# Patient Record
Sex: Female | Born: 1970 | Race: White | Hispanic: No | Marital: Married | Smoking: Never smoker
Health system: Southern US, Community
[De-identification: ages and names within clinical notes are randomized; demographics above are authoritative.]

## PROBLEM LIST (undated history)

## (undated) DIAGNOSIS — T7840XA Allergy, unspecified, initial encounter: Secondary | ICD-10-CM

## (undated) DIAGNOSIS — E039 Hypothyroidism, unspecified: Secondary | ICD-10-CM

## (undated) HISTORY — DX: Allergy, unspecified, initial encounter: T78.40XA

---

## 2006-09-20 HISTORY — PX: COLONOSCOPY: SHX174

## 2014-08-20 ENCOUNTER — Other Ambulatory Visit: Payer: Self-pay

## 2014-08-20 DIAGNOSIS — Z1231 Encounter for screening mammogram for malignant neoplasm of breast: Secondary | ICD-10-CM

## 2014-09-02 ENCOUNTER — Ambulatory Visit
Admission: RE | Admit: 2014-09-02 | Discharge: 2014-09-02 | Disposition: A | Discharge status: Home / Self Care | Payer: BC Managed Care – PPO | Source: Ambulatory Visit

## 2014-09-02 DIAGNOSIS — Z1231 Encounter for screening mammogram for malignant neoplasm of breast: Secondary | ICD-10-CM

## 2014-11-11 ENCOUNTER — Other Ambulatory Visit: Payer: Self-pay | Admitting: Obstetrics & Gynecology

## 2014-11-11 ENCOUNTER — Other Ambulatory Visit (HOSPITAL_COMMUNITY)
Admission: RE | Admit: 2014-11-11 | Discharge: 2014-11-11 | Disposition: A | Discharge status: Home / Self Care | Payer: BC Managed Care – PPO | Source: Ambulatory Visit | Attending: Obstetrics & Gynecology | Admitting: Obstetrics & Gynecology

## 2014-11-11 DIAGNOSIS — Z1151 Encounter for screening for human papillomavirus (HPV): Secondary | ICD-10-CM | POA: Diagnosis present

## 2014-11-11 DIAGNOSIS — Z01419 Encounter for gynecological examination (general) (routine) without abnormal findings: Secondary | ICD-10-CM | POA: Insufficient documentation

## 2014-11-12 LAB — CYTOLOGY - PAP

## 2015-11-17 ENCOUNTER — Other Ambulatory Visit: Payer: Self-pay

## 2015-11-17 DIAGNOSIS — Z1231 Encounter for screening mammogram for malignant neoplasm of breast: Secondary | ICD-10-CM

## 2015-11-27 ENCOUNTER — Ambulatory Visit
Admission: RE | Admit: 2015-11-27 | Discharge: 2015-11-27 | Disposition: A | Discharge status: Home / Self Care | Payer: PRIVATE HEALTH INSURANCE | Source: Ambulatory Visit

## 2015-11-27 DIAGNOSIS — Z1231 Encounter for screening mammogram for malignant neoplasm of breast: Secondary | ICD-10-CM

## 2015-11-28 ENCOUNTER — Other Ambulatory Visit: Payer: Self-pay | Admitting: Obstetrics & Gynecology

## 2015-11-28 DIAGNOSIS — N63 Unspecified lump in unspecified breast: Secondary | ICD-10-CM

## 2015-12-04 ENCOUNTER — Ambulatory Visit
Admission: RE | Admit: 2015-12-04 | Discharge: 2015-12-04 | Disposition: A | Discharge status: Home / Self Care | Payer: PRIVATE HEALTH INSURANCE | Source: Ambulatory Visit | Attending: Obstetrics & Gynecology | Admitting: Obstetrics & Gynecology

## 2015-12-04 DIAGNOSIS — N63 Unspecified lump in unspecified breast: Secondary | ICD-10-CM

## 2016-08-21 ENCOUNTER — Emergency Department (HOSPITAL_BASED_OUTPATIENT_CLINIC_OR_DEPARTMENT_OTHER)
Admission: EM | Admit: 2016-08-21 | Discharge: 2016-08-21 | Disposition: A | Discharge status: Home / Self Care | Payer: BLUE CROSS/BLUE SHIELD | Attending: Emergency Medicine | Admitting: Emergency Medicine

## 2016-08-21 ENCOUNTER — Encounter (HOSPITAL_BASED_OUTPATIENT_CLINIC_OR_DEPARTMENT_OTHER): Payer: Self-pay | Admitting: Emergency Medicine

## 2016-08-21 DIAGNOSIS — M791 Myalgia: Secondary | ICD-10-CM | POA: Insufficient documentation

## 2016-08-21 DIAGNOSIS — Z79899 Other long term (current) drug therapy: Secondary | ICD-10-CM | POA: Insufficient documentation

## 2016-08-21 DIAGNOSIS — R200 Anesthesia of skin: Secondary | ICD-10-CM | POA: Insufficient documentation

## 2016-08-21 DIAGNOSIS — R51 Headache: Secondary | ICD-10-CM | POA: Insufficient documentation

## 2016-08-21 DIAGNOSIS — R5383 Other fatigue: Secondary | ICD-10-CM | POA: Diagnosis not present

## 2016-08-21 DIAGNOSIS — R519 Headache, unspecified: Secondary | ICD-10-CM

## 2016-08-21 DIAGNOSIS — E039 Hypothyroidism, unspecified: Secondary | ICD-10-CM | POA: Diagnosis not present

## 2016-08-21 HISTORY — DX: Hypothyroidism, unspecified: E03.9

## 2016-08-21 MED ORDER — VALACYCLOVIR HCL 1 G PO TABS: 1000.0000 mg | ORAL_TABLET | Freq: Three times a day (TID) | ORAL | 0 refills | Status: DC

## 2016-08-21 MED ORDER — ACETAMINOPHEN-CODEINE #3 300-30 MG PO TABS: 1.0000 | ORAL_TABLET | Freq: Four times a day (QID) | ORAL | 0 refills | Status: AC | PRN

## 2016-08-21 MED ORDER — CARBAMAZEPINE 200 MG PO TABS: 100.0000 mg | ORAL_TABLET | Freq: Two times a day (BID) | ORAL | 0 refills | Status: DC

## 2016-08-21 NOTE — ED Provider Notes (Signed)
[facility] Provider Note   CSN: [id number] Arrival date & time: 08/21/16  1454  By signing my name below, I, Teofilo PodMatthew P. Jamison, attest that this documentation has been prepared under the direction and in the presence of Tilden FossaElizabeth Toni Demo, MD . Electronically Signed: Teofilo PodMatthew P. Jamison, ED Scribe. 08/21/2016. 3:48 PM.    History   Chief Complaint Chief Complaint  Patient presents with  . Facial Pain    The history is provided by the patient. No language interpreter was used.   HPI Comments:  Bianca Cooper is a 45 y.o. female who presents to the Emergency Department complaining of intermittent, worsening right sided facial pain x 1 week. Pt does not feel that the pt is coming from her teeth, and notes that the pain radiates to her neck. Pt describes the pain as "intense," and states that the episodes have not been lasting as long as the did at onset. Pt states that episodes last <10 minutes. Pt states that the area is tender to the touch. Pt has seen a dentist, endodontist and PCP this week for evaluation but could not idententify the cause of the pain. Pt was referred to the ED. Pt reports a short episode of numbness at 1pm today that has resolved, in addition to associated chills, general fatigue, and generalized body aches. Pt reports sick contact, husband had bronchitis 2 weeks ago. Pt was given Augmentin, tylenol, clindamycin with moderate relief. Pt denies possible pregnancy. Pt denies ear pain, facial swelling, hearing loss, and visual changes.   Past Medical History:  Diagnosis Date  . Hypothyroidism     There are no active problems to display for this patient.   History reviewed. No pertinent surgical history.  OB History    No data available       Home Medications    Prior to Admission medications   Medication Sig Start Date End Date Taking? Authorizing Provider  Acetaminophen-Codeine (TYLENOL WITH CODEINE #3 PO) Take by mouth.   Yes Historical  Provider, MD  clindamycin (CLEOCIN) 300 MG capsule Take 300 mg by mouth 4 (four) times daily.   Yes Historical Provider, MD  levothyroxine (SYNTHROID, LEVOTHROID) 100 MCG tablet Take 100 mcg by mouth daily before breakfast.   Yes Historical Provider, MD    Family History History reviewed. No pertinent family history.  Social History Social History  Substance Use Topics  . Smoking status: Never Smoker  . Smokeless tobacco: Never Used  . Alcohol use No     Allergies   Patient has no known allergies.   Review of Systems Review of Systems  Constitutional: Positive for chills and fatigue.  HENT: Positive for dental problem. Negative for ear pain, facial swelling and hearing loss.   Eyes: Negative for visual disturbance.  Musculoskeletal: Positive for myalgias.  Neurological: Positive for numbness.  All other systems reviewed and are negative.    Physical Exam Updated Vital Signs BP 144/97 (BP Location: Left Arm)   Pulse 95   Temp 98.1 F (36.7 C) (Oral)   Resp 20   Ht 5\' 3"  (1.6 m)   Wt 205 lb (93 kg)   LMP 07/31/2016   SpO2 100%   BMI 36.31 kg/m   Physical Exam  Constitutional: She is oriented to person, place, and time. She appears well-developed and well-nourished.  HENT:  Head: Normocephalic and atraumatic.  Right Ear: External ear normal.  Left Ear: External ear normal.  Nose: Nose normal.  Mouth/Throat: Oropharynx is clear and moist. No  oropharyngeal exudate.  Tenderness over right maxillary sinus  Eyes: Conjunctivae and EOM are normal. Pupils are equal, round, and reactive to light.  Neck: Neck supple.  Cardiovascular: Normal rate and regular rhythm.   No murmur heard. Pulmonary/Chest: Effort normal and breath sounds normal. No respiratory distress.  Musculoskeletal: She exhibits no edema or tenderness.  Lymphadenopathy:    She has no cervical adenopathy.  Neurological: She is alert and oriented to person, place, and time. No cranial nerve deficit.    Skin: Skin is warm and dry.  Psychiatric: She has a normal mood and affect. Her behavior is normal.  Nursing note and vitals reviewed.    ED Treatments / Results  DIAGNOSTIC STUDIES:  Oxygen Saturation is 100% on RA, normal by my interpretation.    COORDINATION OF CARE:  3:48 PM Discussed treatment plan with pt at bedside and pt agreed to plan.   Labs (all labs ordered are listed, but only abnormal results are displayed) Labs Reviewed - No data to display  EKG  EKG Interpretation None       Radiology No results found.  Procedures Procedures (including critical care time)  Medications Ordered in ED Medications - No data to display   Initial Impression / Assessment and Plan / ED Course  I have reviewed the triage vital signs and the nursing notes.  Pertinent labs & imaging results that were available during my care of the patient were reviewed by me and considered in my medical decision making (see chart for details).  Clinical Course     Patient here for violation of right facial pain over the last week with recent workups by dentistry and endodontics. She is nontoxic appearing on examination with no evidence of abscess or deep tissue infection. There are no neurologic deficits. Question trigeminal neuralgia versus developing shingles without rash. Will start treatment for shingles with close outpatient follow-up. Home care return precautions were discussed as well.  Final Clinical Impressions(s) / ED Diagnoses   Final diagnoses:  Facial pain, acute    New Prescriptions New Prescriptions   No medications on file  I personally performed the services described in this documentation, which was scribed in my presence. The recorded information has been reviewed and is accurate.     Tilden FossaElizabeth Juanjesus Pepperman, MD 08/21/16 1626

## 2016-08-21 NOTE — ED Triage Notes (Addendum)
Pt c/o pain to RT side face- has seen dentist, endodontist and PCP this past week for eval, but could not identify cause of pain; pt also reports episode of RT side face feeling numb today around 1pm; no neuro deficits noted

## 2017-09-27 ENCOUNTER — Other Ambulatory Visit: Payer: Self-pay | Admitting: Obstetrics & Gynecology

## 2017-09-27 DIAGNOSIS — Z1231 Encounter for screening mammogram for malignant neoplasm of breast: Secondary | ICD-10-CM

## 2017-10-18 ENCOUNTER — Ambulatory Visit
Admission: RE | Admit: 2017-10-18 | Discharge: 2017-10-18 | Disposition: A | Discharge status: Home / Self Care | Payer: BLUE CROSS/BLUE SHIELD | Source: Ambulatory Visit | Attending: Obstetrics & Gynecology | Admitting: Obstetrics & Gynecology

## 2017-10-18 DIAGNOSIS — Z1231 Encounter for screening mammogram for malignant neoplasm of breast: Secondary | ICD-10-CM

## 2018-10-30 ENCOUNTER — Other Ambulatory Visit: Payer: Self-pay | Admitting: Family Medicine

## 2018-10-30 DIAGNOSIS — Z1231 Encounter for screening mammogram for malignant neoplasm of breast: Secondary | ICD-10-CM

## 2018-11-10 ENCOUNTER — Ambulatory Visit
Admission: RE | Admit: 2018-11-10 | Discharge: 2018-11-10 | Disposition: A | Discharge status: Home / Self Care | Payer: PRIVATE HEALTH INSURANCE | Source: Ambulatory Visit | Attending: Family Medicine | Admitting: Family Medicine

## 2018-11-10 DIAGNOSIS — Z1231 Encounter for screening mammogram for malignant neoplasm of breast: Secondary | ICD-10-CM

## 2020-02-05 MED FILL — LEVOTHYROXINE 100 MCG TABLE: 100 | 30 days supply | Qty: 30 | Fill #0

## 2020-03-06 MED FILL — LEVOTHYROXINE 100 MCG TABLE: 100 | 90 days supply | Qty: 90 | Fill #0

## 2020-07-10 ENCOUNTER — Other Ambulatory Visit: Payer: Self-pay | Admitting: Family Medicine

## 2020-07-10 DIAGNOSIS — Z1231 Encounter for screening mammogram for malignant neoplasm of breast: Secondary | ICD-10-CM

## 2020-07-11 ENCOUNTER — Other Ambulatory Visit: Payer: Self-pay

## 2020-07-11 ENCOUNTER — Ambulatory Visit
Admission: RE | Admit: 2020-07-11 | Discharge: 2020-07-11 | Disposition: A | Discharge status: Home / Self Care | Payer: 59 | Source: Ambulatory Visit | Attending: Family Medicine | Admitting: Family Medicine

## 2020-07-11 DIAGNOSIS — Z1231 Encounter for screening mammogram for malignant neoplasm of breast: Secondary | ICD-10-CM

## 2020-10-01 ENCOUNTER — Ambulatory Visit: Payer: Self-pay | Admitting: Allergy

## 2020-10-10 ENCOUNTER — Encounter: Payer: Self-pay | Admitting: Allergy

## 2020-10-10 ENCOUNTER — Other Ambulatory Visit: Payer: Self-pay

## 2020-10-10 ENCOUNTER — Ambulatory Visit: Payer: 59 | Admitting: Allergy

## 2020-10-10 VITALS — BP 120/86 | HR 97 | Temp 97.3°F | Resp 18 | Ht 63.0 in | Wt 225.8 lb

## 2020-10-10 DIAGNOSIS — T781XXD Other adverse food reactions, not elsewhere classified, subsequent encounter: Secondary | ICD-10-CM | POA: Diagnosis not present

## 2020-10-10 DIAGNOSIS — H1013 Acute atopic conjunctivitis, bilateral: Secondary | ICD-10-CM | POA: Diagnosis not present

## 2020-10-10 DIAGNOSIS — J3089 Other allergic rhinitis: Secondary | ICD-10-CM

## 2020-10-10 MED ORDER — EPINEPHRINE 0.3 MG/0.3ML IJ SOAJ: 0.3000 mg | Freq: Once | INTRAMUSCULAR | 1 refills | Status: AC

## 2020-10-10 NOTE — Patient Instructions (Addendum)
-   Environmental allergy skin prick testing is positive to weed pollen, tree pollen, mold and dust mite. - Allergen avoidance measures discussed/handouts provided - Try Xyzal (levocetirizine) 5 mg daily.  This is a long-acting antihistamine that is over-the-counter like Claritin, Zyrtec and Allegra.  You have not tried this yet thus it may be just as effective or more effective than the options you have tried - Continue Flonase 1 to 2 sprays each nostril daily for nasal congestion - Let us know if nasal drainage becomes an issue and we can recommend a new nasal spray for this - Continue Singulair 10 mg daily at bedtime - Continue Zaditor 1 drop each eye twice a day for itchy watery eyes - Allergen immunotherapy discussed today including protocol, benefits and risk.  Informational handout provided.  If interested in this therapuetic option you can check with your insurance carrier for coverage.   If you would like to proceed you can call our office and schedule a new start appointment.  We will provide with an epinephrine device if you do proceed with immunotherapy  Follow-up in 4 to 6 months or sooner if needed

## 2020-10-10 NOTE — Progress Notes (Signed)
New Patient Note  RE: Bianca Cooper MRN: [id number] DOB: Jan 22, 1971 Date of Office Visit: 10/10/2020  Referring provider: Jarrett Soho, PA-C Primary care provider: Jarrett Soho, PA-C  Chief Complaint: Allergies  History of present illness: Bianca Cooper is a 50 y.o. female presenting today for consultation for Allergic rhinitis.  She believes she had allergy testing about 18 years ago.  She did do allergen immunotherapy up until her child was born about 14 years ago.  She believes she did over a year of immunotherapy.  She does report improvement in her symptoms when she was on immunotherapy.    She moved to [place] from [place].  She states her allergy symptoms are much worse here in [place].  She reports symptoms of itchy, watery eyes, sneezing, congestion, generalized itching, fatigue, headache.  Worse in the spring and fall but symptoms are year-round.  She does do saline rinses couple times a week during peak seasons.  She is currently taking Claritin daily, Flonase 1 spray each nostril daily, singular daily and Zaditor eyedrops twice a day.  She started the Singulair about 2 months ago and does feel like it is helping the congestion.  She states when she was not using eyedrops on a consistent basis she was developing bumps on the inside of her upper eyelid that was requiring steroid drops.  No history of asthma or eczema.  She reports allergy to chocolate.  When she was in elementary school she states she would have emesis after drinking chocolate milk every day and will develop a rash.  She has been avoiding chocolate since.   Review of systems: Review of Systems  Constitutional: Negative.   HENT: Positive for congestion.   Eyes:       See HPI  Respiratory: Negative.   Cardiovascular: Negative.   Gastrointestinal: Negative.   Musculoskeletal: Negative.   Skin: Positive for itching. Negative for rash.  Neurological:  Negative.     All other systems negative unless noted above in HPI  Past medical history: Past Medical History:  Diagnosis Date   Allergies    Hypothyroidism    Hypothyroidism     Past surgical history: Past Surgical History:  Procedure Laterality Date   COLONOSCOPY  2008    Family history:  Family History  Problem Relation Age of Onset   Breast cancer Maternal Aunt 66   Cancer - Other Maternal Aunt    Asthma Mother    Diabetes Mother    Cataracts Mother    Colon polyps Father    Lung cancer Father    Colitis Sister    Colon cancer Maternal Grandmother    Colon cancer Maternal Grandfather    Colon cancer Paternal Grandfather     Social history: She lives in a home without carpeting with gas heating and central cooling.  Cats in the home.  There is a dog outside the home.  There is no concern for water damage, mildew or roaches in the home.  She is a therapist, LCSW.  Denies a smoking history.  Medication List: Current Outpatient Medications  Medication Sig Dispense Refill   acetaminophen-codeine (TYLENOL #3) 300-30 MG tablet Take 1-2 tablets by mouth every 6 (six) hours as needed for moderate pain. 15 tablet 0   fluticasone (FLONASE) 50 MCG/ACT nasal spray 1 spray in each nostril     ketotifen (ZADITOR) 0.025 % ophthalmic solution 1 drop into affected eye     levothyroxine (SYNTHROID, LEVOTHROID) 100 MCG tablet Take  100 mcg by mouth daily before breakfast.     loratadine (CLARITIN) 10 MG tablet 1 tablet     No current facility-administered medications for this visit.    Known medication allergies: No Known Allergies   Physical examination: Blood pressure 120/86, pulse 97, temperature (!) 97.3 F (36.3 C), temperature source Temporal, resp. rate 18, height 5\' 3"  (1.6 m), weight 225 lb 12.8 oz (102.4 kg), SpO2 99 %.  General: Alert, interactive, in no acute distress. HEENT: PERRLA, TMs pearly gray, turbinates moderately edematous without  discharge, post-pharynx non erythematous. Neck: Supple without lymphadenopathy. Lungs: Clear to auscultation without wheezing, rhonchi or rales. {no increased work of breathing. CV: Normal S1, S2 without murmurs. Abdomen: Nondistended, nontender. Skin: Warm and dry, without lesions or rashes. Extremities:  No clubbing, cyanosis or edema. Neuro:   Grossly intact.  Diagnositics/Labs:  Allergy testing: Environmental allergy skin prick testing is positive to short ragweed, giant ragweed, ash, birch, beech, Box Elder, 6 red cedar, oak, mucor plumbeus, pullulara. Intradermal testing is positive to mold mix 2 and mite mix.  Allergy testing results were read and interpreted by provider, documented by clinical staff.   Assessment and plan: Allergic rhinitis with conjunctivitis  - Environmental allergy skin prick testing is positive to weed pollen, tree pollen, mold and dust mite. - Allergen avoidance measures discussed/handouts provided - Try Xyzal (levocetirizine) 5 mg daily.  This is a long-acting antihistamine that is over-the-counter like Claritin, Zyrtec and Allegra.  You have not tried this yet thus it may be just as effective or more effective than the options you have tried - Continue Flonase 1 to 2 sprays each nostril daily for nasal congestion - Let know if nasal drainage becomes an issue and we can recommend a new nasal spray for this - Continue Singulair 10 mg daily at bedtime - Continue Zaditor 1 drop each eye twice a day for itchy watery eyes - Allergen immunotherapy discussed today including protocol, benefits and risk.  Informational handout provided.  If interested in this therapuetic option you can check with your insurance carrier for coverage.   If you would like to proceed you can call our office and schedule a new start appointment.  We will provide with an epinephrine device if you do proceed with immunotherapy  Adverse food reaction -Continue avoidance of chocolate in  diet  Follow-up in 4 to 6 months or sooner if needed  I appreciate the opportunity to take part in Yara's care. Please do not hesitate to contact me with questions.  Sincerely,   [place], MD Allergy/Immunology [facility]

## 2020-12-09 DIAGNOSIS — J301 Allergic rhinitis due to pollen: Secondary | ICD-10-CM

## 2020-12-09 NOTE — Progress Notes (Signed)
Aeroallergen Immunotherapy    Patient Details  Name: Bianca Cooper  MRN: [id number]  Date of Birth: 17-Mar-1971   Order 2 of 2   Vial Label: Mold, mite   0.2 ml (Volume) 1:10 Concentration -- Aspergillus mix  0.2 ml (Volume) 1:10 Concentration -- Penicillium mix  0.2 ml (Volume) 1:10 Concentration -- Mucor plumbeus  0.2 ml (Volume) 1:40 Concentration -- Aureobasidium pullulans  0.5 ml (Volume)  AU Concentration -- Mite Mix (DF 5,000 & DP 5,000)    1.3 ml Extract Subtotal  3.7 ml Diluent  5.0 ml Maintenance Total    Final Concentration above is stated in weight/volume (wt/vol). Allergen units (AU/ml) biological units (BAU/ml). The total volume is 5 ml.    Schedule: B   Special Instructions: 1 injection/week

## 2020-12-09 NOTE — Progress Notes (Addendum)
VIALS EXP 12-09-21. LABELS FOR BILLING

## 2020-12-09 NOTE — Progress Notes (Signed)
Aeroallergen Immunotherapy    Patient Details  Name: Bianca Cooper  MRN: [id number]  Date of Birth: May 28, 1971   Order 1 of 2   Vial Label: Pollen   0.3 ml (Volume) 1:20 Concentration -- Ragweed Mix  0.5 ml (Volume) 1:20 Concentration -- Eastern 10 Tree Mix (also Sweet Gum)  0.2 ml (Volume) 1:20 Concentration -- Box Elder  0.2 ml (Volume) 1:10 Concentration -- Cedar, red    1.2 ml Extract Subtotal  3.8 ml Diluent  5.0 ml Maintenance Total    Final Concentration above is stated in weight/volume (wt/vol). Allergen units (AU/ml) biological units (BAU/ml). The total volume is 5 ml.    Schedule: B   Special Instructions: 1 injection/week

## 2020-12-09 NOTE — Addendum Note (Signed)
Addended by: Lorrin Mais on: 12/09/2020 08:53 AM   Modules accepted: Orders

## 2020-12-15 DIAGNOSIS — J3089 Other allergic rhinitis: Secondary | ICD-10-CM

## 2020-12-26 ENCOUNTER — Other Ambulatory Visit: Payer: Self-pay

## 2020-12-26 ENCOUNTER — Ambulatory Visit (INDEPENDENT_AMBULATORY_CARE_PROVIDER_SITE_OTHER): Payer: 59

## 2020-12-26 DIAGNOSIS — J309 Allergic rhinitis, unspecified: Secondary | ICD-10-CM | POA: Diagnosis not present

## 2020-12-26 MED ORDER — EPINEPHRINE 0.3 MG/0.3ML IJ SOAJ: 0.3000 mg | INTRAMUSCULAR | 1 refills | Status: DC | PRN

## 2020-12-26 NOTE — Progress Notes (Signed)
Immunotherapy   Patient Details  Name: Careena Degraffenreid MRN: [id number] Date of Birth: 1971/01/15  12/26/2020  Ollen Barges Barrett-Hilton started injections for  Pollen, Mold-Mite. Following schedule: B  Frequency:1 time per week Epi-Pen:Epi-Pen Available  Consent signed and patient instructions given. Patient waited in office 30 minutes without any issues.    Deborra Medina 12/26/2020, 4:09 PM

## 2021-01-01 ENCOUNTER — Ambulatory Visit (INDEPENDENT_AMBULATORY_CARE_PROVIDER_SITE_OTHER): Payer: 59 | Admitting: *Deleted

## 2021-01-01 DIAGNOSIS — J309 Allergic rhinitis, unspecified: Secondary | ICD-10-CM | POA: Diagnosis not present

## 2021-01-06 ENCOUNTER — Ambulatory Visit (INDEPENDENT_AMBULATORY_CARE_PROVIDER_SITE_OTHER): Payer: 59 | Admitting: *Deleted

## 2021-01-06 DIAGNOSIS — J309 Allergic rhinitis, unspecified: Secondary | ICD-10-CM | POA: Diagnosis not present

## 2021-01-16 ENCOUNTER — Ambulatory Visit (INDEPENDENT_AMBULATORY_CARE_PROVIDER_SITE_OTHER): Payer: 59

## 2021-01-16 DIAGNOSIS — J309 Allergic rhinitis, unspecified: Secondary | ICD-10-CM | POA: Diagnosis not present

## 2021-01-26 ENCOUNTER — Ambulatory Visit (INDEPENDENT_AMBULATORY_CARE_PROVIDER_SITE_OTHER): Payer: 59 | Admitting: *Deleted

## 2021-01-26 DIAGNOSIS — J309 Allergic rhinitis, unspecified: Secondary | ICD-10-CM | POA: Diagnosis not present

## 2021-02-06 ENCOUNTER — Ambulatory Visit (INDEPENDENT_AMBULATORY_CARE_PROVIDER_SITE_OTHER): Payer: 59 | Admitting: *Deleted

## 2021-02-06 DIAGNOSIS — J309 Allergic rhinitis, unspecified: Secondary | ICD-10-CM | POA: Diagnosis not present

## 2021-02-12 ENCOUNTER — Ambulatory Visit (INDEPENDENT_AMBULATORY_CARE_PROVIDER_SITE_OTHER): Payer: 59 | Admitting: *Deleted

## 2021-02-12 DIAGNOSIS — J309 Allergic rhinitis, unspecified: Secondary | ICD-10-CM | POA: Diagnosis not present

## 2021-02-20 ENCOUNTER — Ambulatory Visit (INDEPENDENT_AMBULATORY_CARE_PROVIDER_SITE_OTHER): Payer: 59

## 2021-02-20 DIAGNOSIS — J309 Allergic rhinitis, unspecified: Secondary | ICD-10-CM

## 2021-02-26 ENCOUNTER — Ambulatory Visit (INDEPENDENT_AMBULATORY_CARE_PROVIDER_SITE_OTHER): Payer: 59 | Admitting: *Deleted

## 2021-02-26 DIAGNOSIS — J309 Allergic rhinitis, unspecified: Secondary | ICD-10-CM | POA: Diagnosis not present

## 2021-03-06 ENCOUNTER — Ambulatory Visit (INDEPENDENT_AMBULATORY_CARE_PROVIDER_SITE_OTHER): Payer: 59

## 2021-03-06 DIAGNOSIS — J309 Allergic rhinitis, unspecified: Secondary | ICD-10-CM

## 2021-03-12 ENCOUNTER — Ambulatory Visit (INDEPENDENT_AMBULATORY_CARE_PROVIDER_SITE_OTHER): Payer: 59 | Admitting: *Deleted

## 2021-03-12 DIAGNOSIS — J309 Allergic rhinitis, unspecified: Secondary | ICD-10-CM | POA: Diagnosis not present

## 2021-03-19 ENCOUNTER — Ambulatory Visit (INDEPENDENT_AMBULATORY_CARE_PROVIDER_SITE_OTHER): Payer: 59

## 2021-03-19 DIAGNOSIS — J309 Allergic rhinitis, unspecified: Secondary | ICD-10-CM

## 2021-03-27 ENCOUNTER — Ambulatory Visit (INDEPENDENT_AMBULATORY_CARE_PROVIDER_SITE_OTHER): Payer: 59

## 2021-03-27 DIAGNOSIS — J309 Allergic rhinitis, unspecified: Secondary | ICD-10-CM | POA: Diagnosis not present

## 2021-04-03 ENCOUNTER — Ambulatory Visit (INDEPENDENT_AMBULATORY_CARE_PROVIDER_SITE_OTHER): Payer: 59

## 2021-04-03 DIAGNOSIS — J309 Allergic rhinitis, unspecified: Secondary | ICD-10-CM

## 2021-04-10 ENCOUNTER — Ambulatory Visit (INDEPENDENT_AMBULATORY_CARE_PROVIDER_SITE_OTHER): Payer: 59

## 2021-04-10 DIAGNOSIS — J309 Allergic rhinitis, unspecified: Secondary | ICD-10-CM | POA: Diagnosis not present

## 2021-04-17 ENCOUNTER — Ambulatory Visit (INDEPENDENT_AMBULATORY_CARE_PROVIDER_SITE_OTHER): Payer: 59

## 2021-04-17 DIAGNOSIS — J309 Allergic rhinitis, unspecified: Secondary | ICD-10-CM | POA: Diagnosis not present

## 2021-04-27 ENCOUNTER — Ambulatory Visit (INDEPENDENT_AMBULATORY_CARE_PROVIDER_SITE_OTHER): Payer: 59 | Admitting: *Deleted

## 2021-04-27 DIAGNOSIS — J309 Allergic rhinitis, unspecified: Secondary | ICD-10-CM

## 2021-05-05 ENCOUNTER — Ambulatory Visit (INDEPENDENT_AMBULATORY_CARE_PROVIDER_SITE_OTHER): Payer: 59 | Admitting: *Deleted

## 2021-05-05 DIAGNOSIS — J309 Allergic rhinitis, unspecified: Secondary | ICD-10-CM | POA: Diagnosis not present

## 2021-05-15 ENCOUNTER — Ambulatory Visit (INDEPENDENT_AMBULATORY_CARE_PROVIDER_SITE_OTHER): Payer: 59 | Admitting: *Deleted

## 2021-05-15 DIAGNOSIS — J309 Allergic rhinitis, unspecified: Secondary | ICD-10-CM

## 2021-05-21 ENCOUNTER — Ambulatory Visit (INDEPENDENT_AMBULATORY_CARE_PROVIDER_SITE_OTHER): Payer: 59 | Admitting: *Deleted

## 2021-05-21 DIAGNOSIS — J309 Allergic rhinitis, unspecified: Secondary | ICD-10-CM | POA: Diagnosis not present

## 2021-05-29 ENCOUNTER — Ambulatory Visit (INDEPENDENT_AMBULATORY_CARE_PROVIDER_SITE_OTHER): Payer: 59

## 2021-05-29 DIAGNOSIS — J309 Allergic rhinitis, unspecified: Secondary | ICD-10-CM

## 2021-06-05 ENCOUNTER — Ambulatory Visit (INDEPENDENT_AMBULATORY_CARE_PROVIDER_SITE_OTHER): Payer: 59

## 2021-06-05 DIAGNOSIS — J309 Allergic rhinitis, unspecified: Secondary | ICD-10-CM | POA: Diagnosis not present

## 2021-06-12 ENCOUNTER — Ambulatory Visit (INDEPENDENT_AMBULATORY_CARE_PROVIDER_SITE_OTHER): Payer: 59

## 2021-06-12 DIAGNOSIS — J309 Allergic rhinitis, unspecified: Secondary | ICD-10-CM

## 2021-07-09 ENCOUNTER — Ambulatory Visit (INDEPENDENT_AMBULATORY_CARE_PROVIDER_SITE_OTHER): Payer: 59 | Admitting: *Deleted

## 2021-07-09 DIAGNOSIS — J309 Allergic rhinitis, unspecified: Secondary | ICD-10-CM

## 2021-07-21 ENCOUNTER — Ambulatory Visit (INDEPENDENT_AMBULATORY_CARE_PROVIDER_SITE_OTHER): Payer: 59 | Admitting: *Deleted

## 2021-07-21 DIAGNOSIS — J309 Allergic rhinitis, unspecified: Secondary | ICD-10-CM

## 2021-07-30 ENCOUNTER — Ambulatory Visit (INDEPENDENT_AMBULATORY_CARE_PROVIDER_SITE_OTHER): Payer: 59

## 2021-07-30 DIAGNOSIS — J309 Allergic rhinitis, unspecified: Secondary | ICD-10-CM | POA: Diagnosis not present

## 2021-08-07 ENCOUNTER — Ambulatory Visit (INDEPENDENT_AMBULATORY_CARE_PROVIDER_SITE_OTHER): Payer: 59

## 2021-08-07 DIAGNOSIS — J309 Allergic rhinitis, unspecified: Secondary | ICD-10-CM | POA: Diagnosis not present

## 2021-08-21 ENCOUNTER — Ambulatory Visit (INDEPENDENT_AMBULATORY_CARE_PROVIDER_SITE_OTHER): Payer: 59

## 2021-08-21 DIAGNOSIS — J309 Allergic rhinitis, unspecified: Secondary | ICD-10-CM | POA: Diagnosis not present

## 2021-09-01 ENCOUNTER — Other Ambulatory Visit: Payer: Self-pay | Admitting: Family Medicine

## 2021-09-01 DIAGNOSIS — Z1231 Encounter for screening mammogram for malignant neoplasm of breast: Secondary | ICD-10-CM

## 2021-09-03 ENCOUNTER — Ambulatory Visit (INDEPENDENT_AMBULATORY_CARE_PROVIDER_SITE_OTHER): Payer: 59

## 2021-09-03 DIAGNOSIS — J309 Allergic rhinitis, unspecified: Secondary | ICD-10-CM

## 2021-09-04 ENCOUNTER — Other Ambulatory Visit: Payer: Self-pay

## 2021-09-04 ENCOUNTER — Ambulatory Visit
Admission: RE | Admit: 2021-09-04 | Discharge: 2021-09-04 | Disposition: A | Discharge status: Home / Self Care | Payer: 59 | Source: Ambulatory Visit

## 2021-09-04 DIAGNOSIS — Z1231 Encounter for screening mammogram for malignant neoplasm of breast: Secondary | ICD-10-CM

## 2021-09-15 NOTE — Progress Notes (Signed)
VIALS MADE. EXP 09-15-22 °

## 2021-09-16 DIAGNOSIS — J301 Allergic rhinitis due to pollen: Secondary | ICD-10-CM | POA: Diagnosis not present

## 2021-09-17 ENCOUNTER — Ambulatory Visit (INDEPENDENT_AMBULATORY_CARE_PROVIDER_SITE_OTHER): Payer: 59 | Admitting: *Deleted

## 2021-09-17 DIAGNOSIS — J309 Allergic rhinitis, unspecified: Secondary | ICD-10-CM

## 2021-09-18 DIAGNOSIS — J3089 Other allergic rhinitis: Secondary | ICD-10-CM

## 2021-09-28 ENCOUNTER — Ambulatory Visit (INDEPENDENT_AMBULATORY_CARE_PROVIDER_SITE_OTHER): Payer: 59 | Admitting: *Deleted

## 2021-09-28 DIAGNOSIS — J309 Allergic rhinitis, unspecified: Secondary | ICD-10-CM

## 2021-10-07 ENCOUNTER — Ambulatory Visit (INDEPENDENT_AMBULATORY_CARE_PROVIDER_SITE_OTHER): Payer: 59

## 2021-10-07 DIAGNOSIS — J309 Allergic rhinitis, unspecified: Secondary | ICD-10-CM

## 2021-10-15 ENCOUNTER — Ambulatory Visit (INDEPENDENT_AMBULATORY_CARE_PROVIDER_SITE_OTHER): Payer: 59

## 2021-10-15 DIAGNOSIS — J309 Allergic rhinitis, unspecified: Secondary | ICD-10-CM

## 2021-10-22 ENCOUNTER — Ambulatory Visit (INDEPENDENT_AMBULATORY_CARE_PROVIDER_SITE_OTHER): Payer: 59

## 2021-10-22 DIAGNOSIS — J309 Allergic rhinitis, unspecified: Secondary | ICD-10-CM | POA: Diagnosis not present

## 2021-11-03 ENCOUNTER — Ambulatory Visit (INDEPENDENT_AMBULATORY_CARE_PROVIDER_SITE_OTHER): Payer: 59 | Admitting: *Deleted

## 2021-11-03 DIAGNOSIS — J309 Allergic rhinitis, unspecified: Secondary | ICD-10-CM | POA: Diagnosis not present

## 2021-11-20 ENCOUNTER — Ambulatory Visit (INDEPENDENT_AMBULATORY_CARE_PROVIDER_SITE_OTHER): Payer: 59

## 2021-11-20 DIAGNOSIS — J309 Allergic rhinitis, unspecified: Secondary | ICD-10-CM

## 2021-11-27 ENCOUNTER — Ambulatory Visit (INDEPENDENT_AMBULATORY_CARE_PROVIDER_SITE_OTHER): Payer: 59

## 2021-11-27 DIAGNOSIS — J309 Allergic rhinitis, unspecified: Secondary | ICD-10-CM

## 2021-12-04 ENCOUNTER — Ambulatory Visit (INDEPENDENT_AMBULATORY_CARE_PROVIDER_SITE_OTHER): Payer: 59

## 2021-12-04 DIAGNOSIS — J309 Allergic rhinitis, unspecified: Secondary | ICD-10-CM | POA: Diagnosis not present

## 2021-12-07 ENCOUNTER — Ambulatory Visit: Payer: 59 | Admitting: Family

## 2021-12-10 NOTE — Progress Notes (Addendum)
? ?[address] ? [place] [address] ?Dept: [phone] ? ?FOLLOW UP NOTE ? ?Patient ID: Bianca Cooper, female    DOB: 01-Jun-1971  Age: 51 y.o. MRN: [id number] ?Date of Office Visit: 12/11/2021 ? ?Assessment  ?Chief Complaint: Allergic Rhinitis  (Better somewhat) ? ?HPI ?Bianca Cooper is a 51 year old female who presents to the clinic for follow-up visit.  She was last seen in this clinic on 10/10/2020 by Dr. Delorse Lek for evaluation of allergic rhinitis, allergic conjunctivitis, and food allergy to chocolate.  At that time she had allergy skin testing that was positive to pollen, dust mite, and mold and began allergen immunotherapy.  At today's visit, she reports her allergic rhinitis has been moderately well controlled with symptoms including nasal congestion, sneeze, and postnasal drainage.  She continues Claritin 10 mg once a day and Flonase daily.  She does report poor application technique with Flonase nasal spray and she is not currently using a nasal saline rinse.  She reports there has been some improvement since beginning allergy injections on 12/26/2020.  She reports that while she was taking Xyzal and montelukast she began to experience nightmares as well as urinary retention which cleared when she stopped taking these medications.  Allergic conjunctivitis is reported as moderately well controlled with red and itchy eyes occurring frequently for which he uses Zaditor eyedrops twice a day with relief of symptoms.  She continues to avoid chocolate with no accidental ingestion or EpiPen use since her last visit to this clinic.  She reports vomiting and hives with previous chocolate consumption.  She reports her EpiPen's are up-to-date.  Her current medications are listed in the chart. ? ? ?Drug Allergies:  ?Allergies  ?Allergen Reactions  ? Levocetirizine Other (See Comments)  ?  Urinary retention and nightmares  ? ? ?Physical Exam: ?BP 126/84   Pulse 73   Temp (!)  97.2 ?F (36.2 ?C)   Resp 16   Ht 5\' 3"  (1.6 m)   Wt 229 lb 9.6 oz (104.1 kg)   SpO2 96%   BMI 40.67 kg/m?   ? ?Physical Exam ?Vitals reviewed.  ?Constitutional:   ?   Appearance: Normal appearance.  ?HENT:  ?   Head: Normocephalic and atraumatic.  ?   Right Ear: Tympanic membrane normal.  ?   Left Ear: Tympanic membrane normal.  ?   Nose:  ?   Comments: Septal deviation noted.  Bilateral nares slightly erythematous with clear nasal drainage noted.  Pharynx normal.  Ears normal.  Eyes normal. ?   Mouth/Throat:  ?   Pharynx: Oropharynx is clear.  ?Eyes:  ?   Conjunctiva/sclera: Conjunctivae normal.  ?Cardiovascular:  ?   Rate and Rhythm: Normal rate and regular rhythm.  ?   Heart sounds: Normal heart sounds. No murmur heard. ?Pulmonary:  ?   Effort: Pulmonary effort is normal.  ?   Breath sounds: Normal breath sounds.  ?   Comments: Lungs clear to auscultation ?Musculoskeletal:     ?   General: Normal range of motion.  ?   Cervical back: Normal range of motion and neck supple.  ?Skin: ?   General: Skin is warm and dry.  ?Neurological:  ?   Mental Status: She is alert and oriented to person, place, and time.  ?Psychiatric:     ?   Mood and Affect: Mood normal.     ?   Behavior: Behavior normal.     ?   Thought Content: Thought content normal.     ?  Judgment: Judgment normal.  ? ? ?Assessment and Plan: ?1. Seasonal and perennial allergic rhinitis   ?2. Allergic conjunctivitis of both eyes   ?3. Allergy with anaphylaxis due to food   ? ? ?Meds ordered this encounter  ?Medications  ? EPINEPHrine 0.3 mg/0.3 mL IJ SOAJ injection  ?  Sig: Inject 0.3 mg into the muscle as needed for anaphylaxis. As needed for life-threatening allergic reactions  ?  Dispense:  2 each  ?  Refill:  1  ? ? ?Patient Instructions  ?Allergic rhinitis ?Continue allergen avoidance measures directed toward pollen, mold, and dust mite as listed below ?Continue allergen immunotherapy once a week and have access to an epinephrine autoinjector  set ?Continue Claritin mg once a day as needed for runny nose or itch ?Continue Flonase 2 sprays in each nostril once a day as needed for stuffy nose.  In the right nostril, point the applicator out toward the right ear. In the left nostril, point the applicator out toward the left ear. Consider Xhance if no relief of nasal congestion ?Consider saline nasal rinses as needed for nasal symptoms. Use this before any medicated nasal sprays for best result ? ?Allergic conjunctivitis ?Some over the counter eye drops include Pataday one drop in each eye once a day as needed for red, itchy eyes OR Zaditor one drop in each eye twice a day as needed for red itchy eyes. ? ?Food allergy ?Continue to avoid chocolate. In case of an allergic reaction, take Benadryl 50 mg every 4 hours, and if life-threatening symptoms occur, inject with EpiPen 0.3 mg. ? ?Call the clinic if this treatment plan is not working well for you. ? ?Follow up in 1 year or sooner if needed. ? ? ?Return in about 1 year (around 12/12/2022), or if symptoms worsen or fail to improve. ?  ? ?Thank you for the opportunity to care for this patient.  Please do not hesitate to contact me with questions. ? ?Thermon Leyland, FNP ?[facility] ? ? ? ? ? ?

## 2021-12-10 NOTE — Patient Instructions (Addendum)
Allergic rhinitis ?Continue allergen avoidance measures directed toward pollen, mold, and dust mite as listed below ?Continue allergen immunotherapy once a week and have access to an epinephrine autoinjector set ?Continue Claritin mg once a day as needed for runny nose or itch ?Continue Flonase 2 sprays in each nostril once a day as needed for stuffy nose.  In the right nostril, point the applicator out toward the right ear. In the left nostril, point the applicator out toward the left ear. Consider Xhance if no relief of nasal congestion ?Consider saline nasal rinses as needed for nasal symptoms. Use this before any medicated nasal sprays for best result ? ?Allergic conjunctivitis ?Some over the counter eye drops include Pataday one drop in each eye once a day as needed for red, itchy eyes OR Zaditor one drop in each eye twice a day as needed for red itchy eyes. ? ?Food allergy ?Continue to avoid chocolate. In case of an allergic reaction, take Benadryl 50 mg every 4 hours, and if life-threatening symptoms occur, inject with EpiPen 0.3 mg. ? ?Call the clinic if this treatment plan is not working well for you. ? ?Follow up in 1 year or sooner if needed. ? ?Reducing Pollen Exposure ?The American Academy of Allergy, Asthma and Immunology suggests the following steps to reduce your exposure to pollen during allergy seasons. ?Do not hang sheets or clothing out to dry; pollen may collect on these items. ?Do not mow lawns or spend time around freshly cut grass; mowing stirs up pollen. ?Keep windows closed at night.  Keep car windows closed while driving. ?Minimize morning activities outdoors, a time when pollen counts are usually at their highest. ?Stay indoors as much as possible when pollen counts or humidity is high and on windy days when pollen tends to remain in the air longer. ?Use air conditioning when possible.  Many air conditioners have filters that trap the pollen spores. ?Use a HEPA room air filter to remove  pollen form the indoor air you breathe. ? ?Control of Mold Allergen ?Mold and fungi can grow on a variety of surfaces provided certain temperature and moisture conditions exist.  Outdoor molds grow on plants, decaying vegetation and soil.  The major outdoor mold, Alternaria and Cladosporium, are found in very high numbers during hot and dry conditions.  Generally, a late Summer - Fall peak is seen for common outdoor fungal spores.  Rain will temporarily lower outdoor mold spore count, but counts rise rapidly when the rainy period ends.  The most important indoor molds are Aspergillus and Penicillium.  Dark, humid and poorly ventilated basements are ideal sites for mold growth.  The next most common sites of mold growth are the bathroom and the kitchen. ? ?Outdoor [employer] ?Use air conditioning and keep windows closed ?Avoid exposure to decaying vegetation. ?Avoid leaf raking. ?Avoid grain handling. ?Consider wearing a face mask if working in moldy areas. ? ?Indoor Mold Control ?Maintain humidity below 50%. ?Clean washable surfaces with 5% bleach solution. ?Remove sources e.g. Contaminated carpets. ? ? ?Control of Dust Mite Allergen ?Dust mites play a major role in allergic asthma and rhinitis. They occur in environments with high humidity wherever human skin is found. Dust mites absorb humidity from the atmosphere (ie, they do not drink) and feed on organic matter (including shed human and animal skin). Dust mites are a microscopic type of insect that you cannot see with the naked eye. High levels of dust mites have been detected from mattresses, pillows, carpets, upholstered  furniture, bed covers, clothes, soft toys and any woven material. The principal allergen of the dust mite is found in its feces. A gram of dust may contain 1,000 mites and 250,000 fecal particles. Mite antigen is easily measured in the air during house cleaning activities. Dust mites do not bite and do not cause harm to humans, other than  by triggering allergies/asthma. ? ?Ways to decrease your exposure to dust mites in your home: ? ?1. Encase mattresses, box springs and pillows with a mite-impermeable barrier or cover ? ?2. Wash sheets, blankets and drapes weekly in hot water (130? F) with detergent and dry them in a dryer on the hot setting. ? ?3. Have the room cleaned frequently with a vacuum cleaner and a damp dust-mop. For carpeting or rugs, vacuuming with a vacuum cleaner equipped with a high-efficiency particulate air (HEPA) filter. The dust mite allergic individual should not be in a room which is being cleaned and should wait 1 hour after cleaning before going into the room. ? ?4. Do not sleep on upholstered furniture (eg, couches). ? ?5. If possible removing carpeting, upholstered furniture and drapery from the home is ideal. Horizontal blinds should be eliminated in the rooms where the person spends the most time (bedroom, study, television room). Washable vinyl, roller-type shades are optimal. ? ?6. Remove all non-washable stuffed toys from the bedroom. Wash stuffed toys weekly like sheets and blankets above. ? ?7. Reduce indoor humidity to less than 50%. Inexpensive humidity monitors can be purchased at most hardware stores. Do not use a humidifier as can make the problem worse and are not recommended. ? ?

## 2021-12-11 ENCOUNTER — Encounter: Payer: Self-pay | Admitting: Family Medicine

## 2021-12-11 ENCOUNTER — Other Ambulatory Visit: Payer: Self-pay

## 2021-12-11 ENCOUNTER — Ambulatory Visit: Payer: Self-pay

## 2021-12-11 ENCOUNTER — Ambulatory Visit: Payer: 59 | Admitting: Family Medicine

## 2021-12-11 VITALS — BP 126/84 | HR 73 | Temp 97.2°F | Resp 16 | Ht 63.0 in | Wt 229.6 lb

## 2021-12-11 DIAGNOSIS — J3089 Other allergic rhinitis: Secondary | ICD-10-CM

## 2021-12-11 DIAGNOSIS — J309 Allergic rhinitis, unspecified: Secondary | ICD-10-CM

## 2021-12-11 DIAGNOSIS — T7800XA Anaphylactic reaction due to unspecified food, initial encounter: Secondary | ICD-10-CM

## 2021-12-11 DIAGNOSIS — H1013 Acute atopic conjunctivitis, bilateral: Secondary | ICD-10-CM | POA: Insufficient documentation

## 2021-12-11 MED ORDER — EPINEPHRINE 0.3 MG/0.3ML IJ SOAJ: 0.3000 mg | INTRAMUSCULAR | 1 refills | Status: DC | PRN

## 2021-12-17 ENCOUNTER — Ambulatory Visit (INDEPENDENT_AMBULATORY_CARE_PROVIDER_SITE_OTHER): Payer: 59

## 2021-12-17 DIAGNOSIS — J309 Allergic rhinitis, unspecified: Secondary | ICD-10-CM

## 2022-01-01 ENCOUNTER — Ambulatory Visit (INDEPENDENT_AMBULATORY_CARE_PROVIDER_SITE_OTHER): Payer: 59

## 2022-01-01 DIAGNOSIS — J309 Allergic rhinitis, unspecified: Secondary | ICD-10-CM | POA: Diagnosis not present

## 2022-01-11 ENCOUNTER — Ambulatory Visit (INDEPENDENT_AMBULATORY_CARE_PROVIDER_SITE_OTHER): Payer: 59

## 2022-01-11 DIAGNOSIS — J309 Allergic rhinitis, unspecified: Secondary | ICD-10-CM | POA: Diagnosis not present

## 2022-01-21 ENCOUNTER — Ambulatory Visit (INDEPENDENT_AMBULATORY_CARE_PROVIDER_SITE_OTHER): Payer: 59

## 2022-01-21 DIAGNOSIS — J309 Allergic rhinitis, unspecified: Secondary | ICD-10-CM | POA: Diagnosis not present

## 2022-01-28 ENCOUNTER — Ambulatory Visit (INDEPENDENT_AMBULATORY_CARE_PROVIDER_SITE_OTHER): Payer: 59

## 2022-01-28 DIAGNOSIS — J309 Allergic rhinitis, unspecified: Secondary | ICD-10-CM | POA: Diagnosis not present

## 2022-02-04 ENCOUNTER — Ambulatory Visit (INDEPENDENT_AMBULATORY_CARE_PROVIDER_SITE_OTHER): Payer: 59

## 2022-02-04 DIAGNOSIS — J309 Allergic rhinitis, unspecified: Secondary | ICD-10-CM

## 2022-02-05 IMAGING — MG MM DIGITAL SCREENING BILAT W/ TOMO AND CAD
8 series · 8 of 24 positions shown · non-contrast
Comparison: Previous exam(s).

CLINICAL DATA: Screening.

EXAM:
DIGITAL SCREENING BILATERAL MAMMOGRAM WITH TOMOSYNTHESIS AND CAD
TECHNIQUE: Bilateral screening digital craniocaudal and mediolateral oblique
mammograms were obtained. Bilateral screening digital breast
tomosynthesis was performed. The images were evaluated with
computer-aided detection.

[R MLO synth-2D]
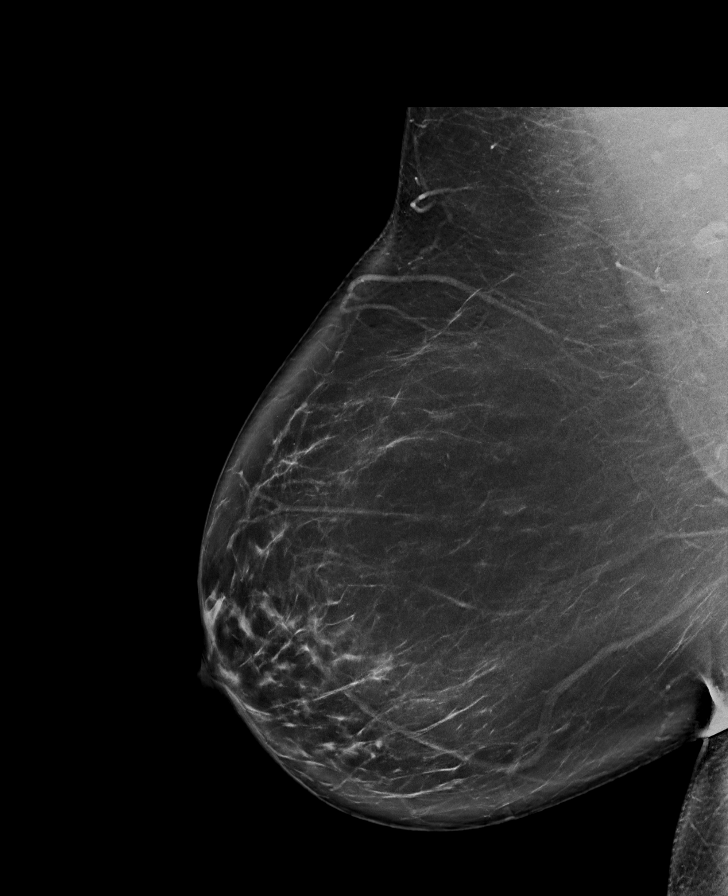

[L MLO synth-2D]
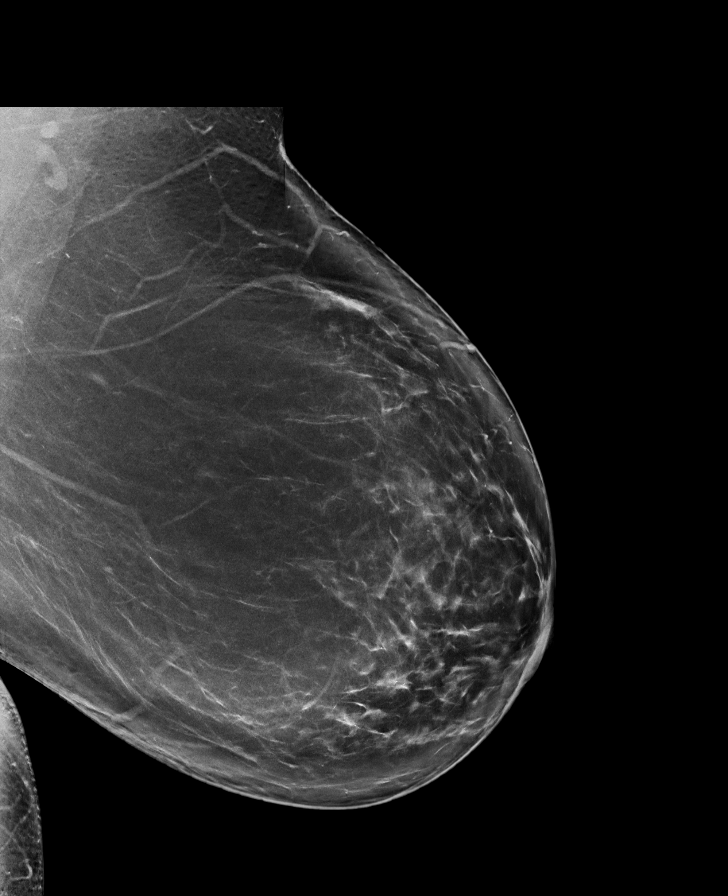

[R CC synth-2D]
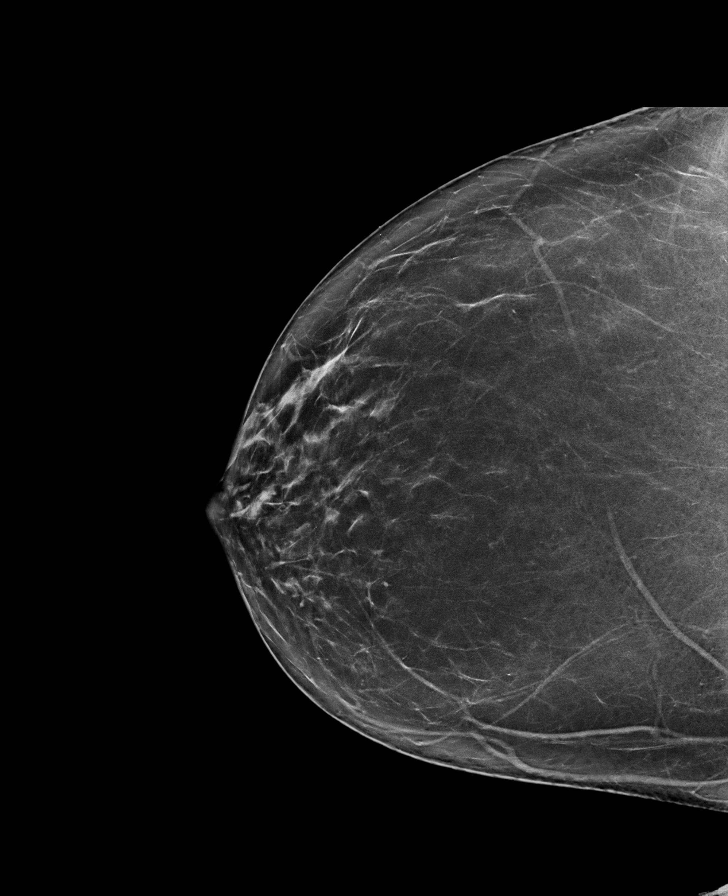

[L CC synth-2D]
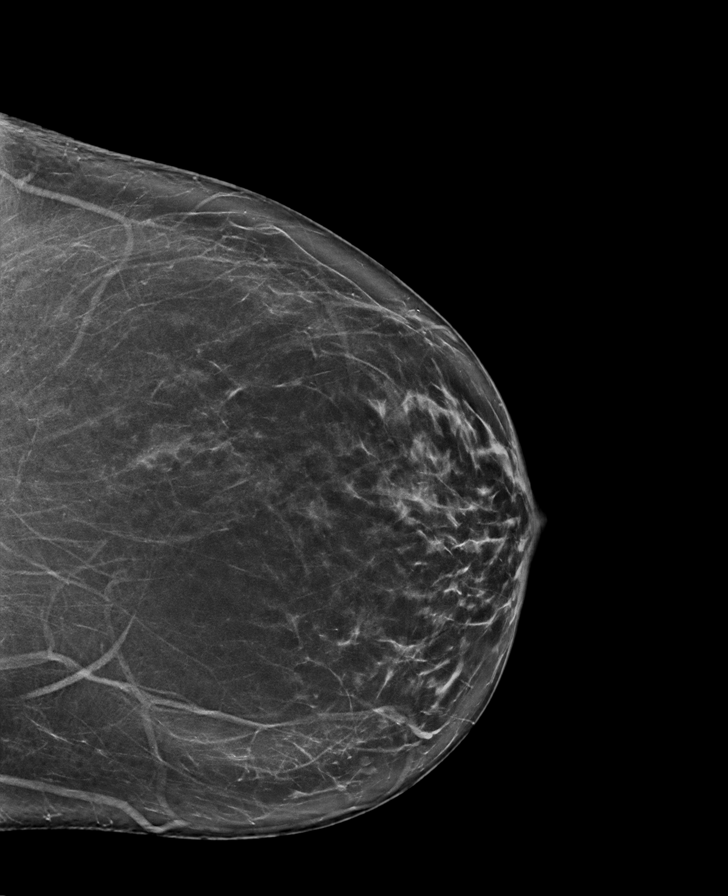

[R MLO tomo · tomo slice 53/104.0]
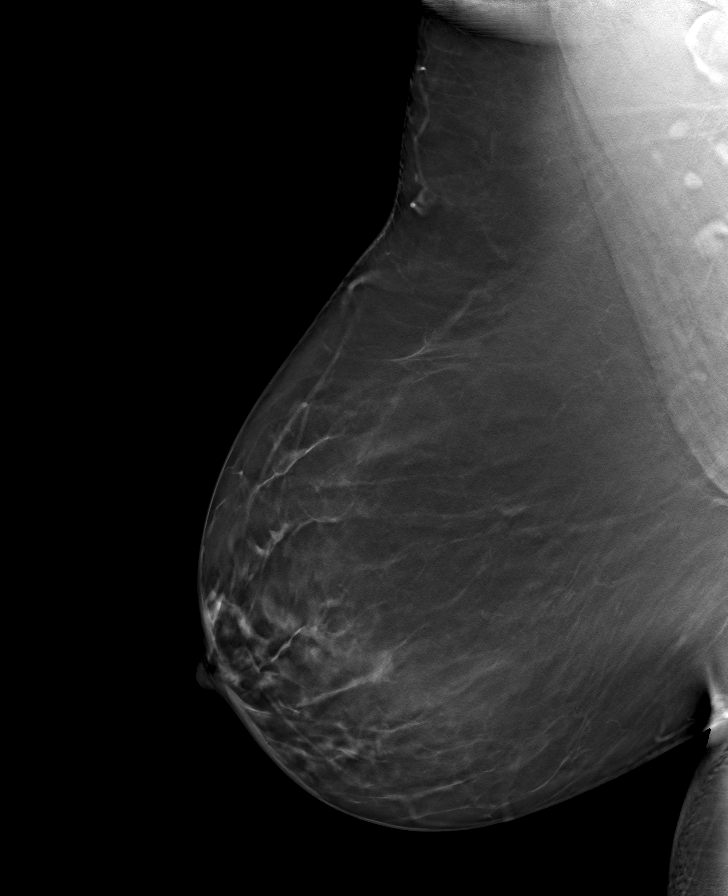

[L CC tomo · tomo slice 42/83.0]
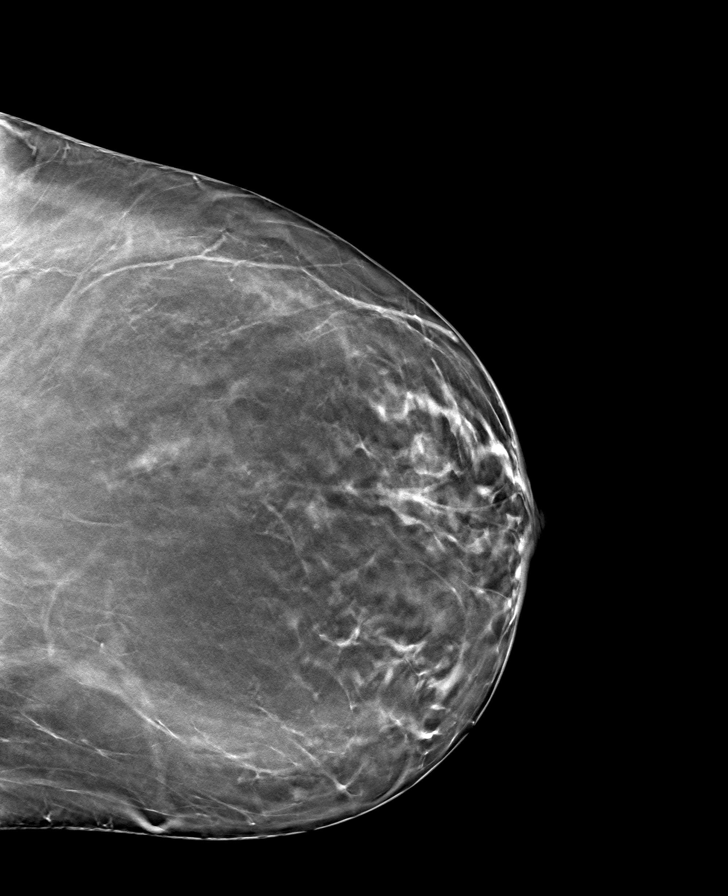

[L MLO tomo · tomo slice 53/104.0]
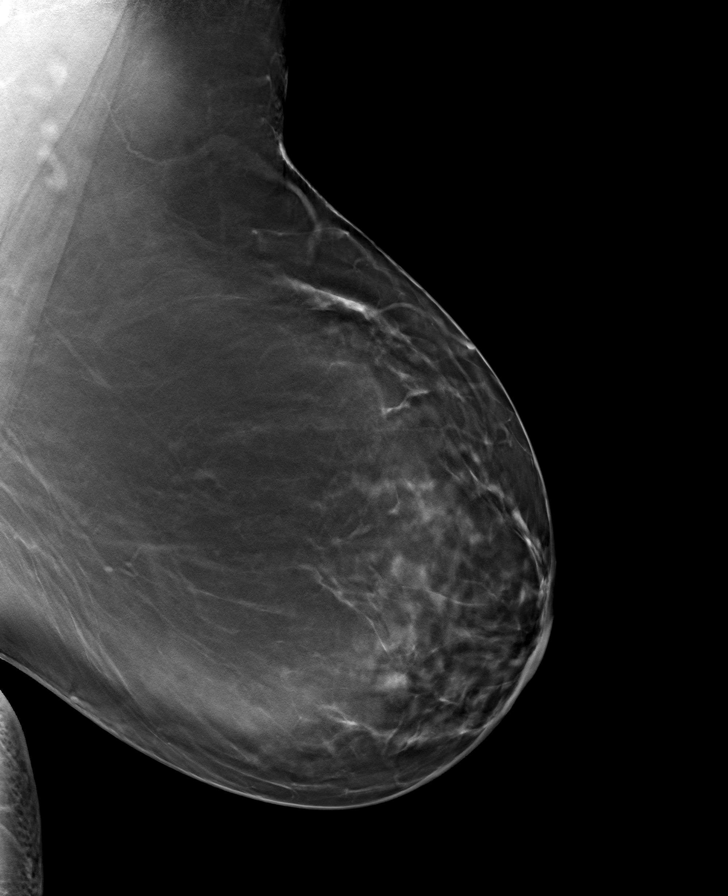

[R CC tomo · tomo slice 42/83.0]
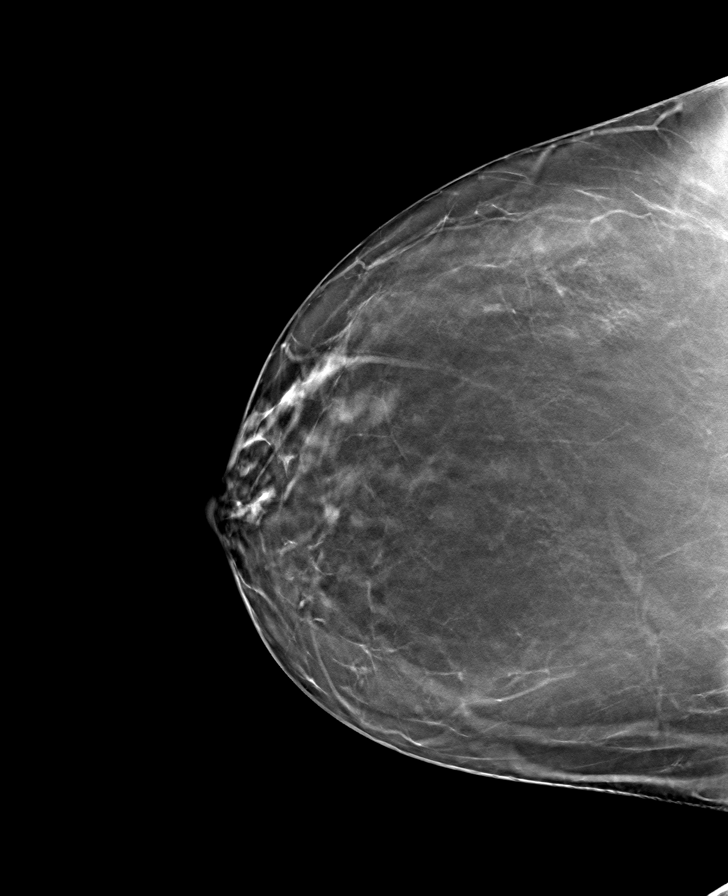

[8 of 24 positions shown; findings below may reference images not displayed]

ACR Breast Density Category b: There are scattered areas of
fibroglandular density.
FINDINGS: There are no findings suspicious for malignancy.
IMPRESSION: No mammographic evidence of malignancy. A result letter of this
screening mammogram will be mailed directly to the patient.

RECOMMENDATION:
Screening mammogram in one year. (Code:51-O-LD2)

BI-RADS CATEGORY  1: Negative.

## 2022-02-11 ENCOUNTER — Ambulatory Visit (INDEPENDENT_AMBULATORY_CARE_PROVIDER_SITE_OTHER): Payer: 59

## 2022-02-11 DIAGNOSIS — J309 Allergic rhinitis, unspecified: Secondary | ICD-10-CM | POA: Diagnosis not present

## 2022-02-17 DIAGNOSIS — J3089 Other allergic rhinitis: Secondary | ICD-10-CM | POA: Diagnosis not present

## 2022-02-17 NOTE — Progress Notes (Signed)
VIALS EXP 02-18-23 

## 2022-02-18 DIAGNOSIS — J301 Allergic rhinitis due to pollen: Secondary | ICD-10-CM | POA: Diagnosis not present

## 2022-02-23 ENCOUNTER — Ambulatory Visit (INDEPENDENT_AMBULATORY_CARE_PROVIDER_SITE_OTHER): Payer: 59

## 2022-02-23 DIAGNOSIS — J309 Allergic rhinitis, unspecified: Secondary | ICD-10-CM | POA: Diagnosis not present

## 2022-03-12 ENCOUNTER — Ambulatory Visit (INDEPENDENT_AMBULATORY_CARE_PROVIDER_SITE_OTHER): Payer: 59

## 2022-03-12 DIAGNOSIS — J309 Allergic rhinitis, unspecified: Secondary | ICD-10-CM | POA: Diagnosis not present

## 2022-03-18 ENCOUNTER — Ambulatory Visit (INDEPENDENT_AMBULATORY_CARE_PROVIDER_SITE_OTHER): Payer: 59

## 2022-03-18 DIAGNOSIS — J309 Allergic rhinitis, unspecified: Secondary | ICD-10-CM

## 2022-03-26 ENCOUNTER — Ambulatory Visit (INDEPENDENT_AMBULATORY_CARE_PROVIDER_SITE_OTHER): Payer: 59 | Admitting: *Deleted

## 2022-03-26 DIAGNOSIS — J309 Allergic rhinitis, unspecified: Secondary | ICD-10-CM

## 2022-04-02 ENCOUNTER — Ambulatory Visit (INDEPENDENT_AMBULATORY_CARE_PROVIDER_SITE_OTHER): Payer: 59

## 2022-04-02 DIAGNOSIS — J309 Allergic rhinitis, unspecified: Secondary | ICD-10-CM

## 2022-04-15 ENCOUNTER — Ambulatory Visit (INDEPENDENT_AMBULATORY_CARE_PROVIDER_SITE_OTHER): Payer: 59

## 2022-04-15 ENCOUNTER — Other Ambulatory Visit: Payer: Self-pay | Admitting: Nurse Practitioner

## 2022-04-15 ENCOUNTER — Other Ambulatory Visit (HOSPITAL_COMMUNITY)
Admission: RE | Admit: 2022-04-15 | Discharge: 2022-04-15 | Disposition: A | Discharge status: Home / Self Care | Payer: 59 | Source: Ambulatory Visit | Attending: Nurse Practitioner | Admitting: Nurse Practitioner

## 2022-04-15 DIAGNOSIS — Z124 Encounter for screening for malignant neoplasm of cervix: Secondary | ICD-10-CM | POA: Insufficient documentation

## 2022-04-15 DIAGNOSIS — J309 Allergic rhinitis, unspecified: Secondary | ICD-10-CM

## 2022-04-20 LAB — CYTOLOGY - PAP
Comment: NEGATIVE
Comment: NEGATIVE
Comment: NEGATIVE
HPV 16: NEGATIVE
HPV 18 / 45: NEGATIVE
High risk HPV: POSITIVE — AB

## 2022-04-30 ENCOUNTER — Ambulatory Visit (INDEPENDENT_AMBULATORY_CARE_PROVIDER_SITE_OTHER): Payer: 59

## 2022-04-30 DIAGNOSIS — J309 Allergic rhinitis, unspecified: Secondary | ICD-10-CM | POA: Diagnosis not present

## 2022-05-12 ENCOUNTER — Ambulatory Visit (INDEPENDENT_AMBULATORY_CARE_PROVIDER_SITE_OTHER): Payer: 59

## 2022-05-12 DIAGNOSIS — J309 Allergic rhinitis, unspecified: Secondary | ICD-10-CM | POA: Diagnosis not present

## 2022-06-04 ENCOUNTER — Ambulatory Visit (INDEPENDENT_AMBULATORY_CARE_PROVIDER_SITE_OTHER): Payer: 59 | Admitting: *Deleted

## 2022-06-04 DIAGNOSIS — J309 Allergic rhinitis, unspecified: Secondary | ICD-10-CM | POA: Diagnosis not present

## 2022-06-17 ENCOUNTER — Ambulatory Visit (INDEPENDENT_AMBULATORY_CARE_PROVIDER_SITE_OTHER): Payer: 59

## 2022-06-17 DIAGNOSIS — J309 Allergic rhinitis, unspecified: Secondary | ICD-10-CM | POA: Diagnosis not present

## 2022-06-28 ENCOUNTER — Ambulatory Visit (INDEPENDENT_AMBULATORY_CARE_PROVIDER_SITE_OTHER): Payer: 59

## 2022-06-28 DIAGNOSIS — J309 Allergic rhinitis, unspecified: Secondary | ICD-10-CM

## 2022-07-07 DIAGNOSIS — J3089 Other allergic rhinitis: Secondary | ICD-10-CM | POA: Diagnosis not present

## 2022-07-07 NOTE — Progress Notes (Signed)
VIALS EXP 07-08-23 

## 2022-07-14 ENCOUNTER — Ambulatory Visit (INDEPENDENT_AMBULATORY_CARE_PROVIDER_SITE_OTHER): Payer: 59 | Admitting: *Deleted

## 2022-07-14 DIAGNOSIS — J309 Allergic rhinitis, unspecified: Secondary | ICD-10-CM | POA: Diagnosis not present

## 2022-07-21 ENCOUNTER — Ambulatory Visit (INDEPENDENT_AMBULATORY_CARE_PROVIDER_SITE_OTHER): Payer: 59

## 2022-07-21 DIAGNOSIS — J309 Allergic rhinitis, unspecified: Secondary | ICD-10-CM | POA: Diagnosis not present

## 2022-07-28 ENCOUNTER — Ambulatory Visit (INDEPENDENT_AMBULATORY_CARE_PROVIDER_SITE_OTHER): Payer: 59 | Admitting: *Deleted

## 2022-07-28 DIAGNOSIS — J309 Allergic rhinitis, unspecified: Secondary | ICD-10-CM | POA: Diagnosis not present

## 2022-08-04 ENCOUNTER — Ambulatory Visit (INDEPENDENT_AMBULATORY_CARE_PROVIDER_SITE_OTHER): Payer: 59 | Admitting: *Deleted

## 2022-08-04 DIAGNOSIS — J309 Allergic rhinitis, unspecified: Secondary | ICD-10-CM | POA: Diagnosis not present

## 2022-08-06 DIAGNOSIS — J301 Allergic rhinitis due to pollen: Secondary | ICD-10-CM | POA: Diagnosis not present

## 2022-08-06 NOTE — Progress Notes (Signed)
Updated vial for billing. 

## 2022-08-10 ENCOUNTER — Ambulatory Visit (INDEPENDENT_AMBULATORY_CARE_PROVIDER_SITE_OTHER): Payer: 59

## 2022-08-10 DIAGNOSIS — J309 Allergic rhinitis, unspecified: Secondary | ICD-10-CM | POA: Diagnosis not present

## 2022-08-18 ENCOUNTER — Ambulatory Visit (INDEPENDENT_AMBULATORY_CARE_PROVIDER_SITE_OTHER): Payer: 59

## 2022-08-18 DIAGNOSIS — J309 Allergic rhinitis, unspecified: Secondary | ICD-10-CM

## 2022-09-01 ENCOUNTER — Ambulatory Visit (INDEPENDENT_AMBULATORY_CARE_PROVIDER_SITE_OTHER): Payer: 59

## 2022-09-01 DIAGNOSIS — J309 Allergic rhinitis, unspecified: Secondary | ICD-10-CM | POA: Diagnosis not present

## 2022-09-08 ENCOUNTER — Ambulatory Visit (INDEPENDENT_AMBULATORY_CARE_PROVIDER_SITE_OTHER): Payer: 59

## 2022-09-08 DIAGNOSIS — J309 Allergic rhinitis, unspecified: Secondary | ICD-10-CM | POA: Diagnosis not present

## 2022-09-22 ENCOUNTER — Ambulatory Visit (INDEPENDENT_AMBULATORY_CARE_PROVIDER_SITE_OTHER): Payer: 59

## 2022-09-22 DIAGNOSIS — J309 Allergic rhinitis, unspecified: Secondary | ICD-10-CM | POA: Diagnosis not present

## 2022-09-29 ENCOUNTER — Ambulatory Visit (INDEPENDENT_AMBULATORY_CARE_PROVIDER_SITE_OTHER): Payer: 59

## 2022-09-29 DIAGNOSIS — J309 Allergic rhinitis, unspecified: Secondary | ICD-10-CM | POA: Diagnosis not present

## 2022-10-06 ENCOUNTER — Ambulatory Visit (INDEPENDENT_AMBULATORY_CARE_PROVIDER_SITE_OTHER): Payer: 59

## 2022-10-06 DIAGNOSIS — J309 Allergic rhinitis, unspecified: Secondary | ICD-10-CM

## 2022-10-11 ENCOUNTER — Other Ambulatory Visit: Payer: Self-pay | Admitting: Family Medicine

## 2022-10-11 DIAGNOSIS — Z1231 Encounter for screening mammogram for malignant neoplasm of breast: Secondary | ICD-10-CM

## 2022-10-20 ENCOUNTER — Ambulatory Visit (INDEPENDENT_AMBULATORY_CARE_PROVIDER_SITE_OTHER): Payer: 59

## 2022-10-20 DIAGNOSIS — J309 Allergic rhinitis, unspecified: Secondary | ICD-10-CM | POA: Diagnosis not present

## 2022-10-28 ENCOUNTER — Other Ambulatory Visit: Payer: Self-pay | Admitting: Family Medicine

## 2022-10-28 DIAGNOSIS — N644 Mastodynia: Secondary | ICD-10-CM

## 2022-11-01 DIAGNOSIS — J301 Allergic rhinitis due to pollen: Secondary | ICD-10-CM | POA: Diagnosis not present

## 2022-11-01 NOTE — Progress Notes (Signed)
VIALS EXP 11-02-23

## 2022-11-02 ENCOUNTER — Ambulatory Visit
Admission: RE | Admit: 2022-11-02 | Discharge: 2022-11-02 | Disposition: A | Discharge status: Home / Self Care | Payer: Self-pay | Source: Ambulatory Visit | Attending: Family Medicine | Admitting: Family Medicine

## 2022-11-02 ENCOUNTER — Ambulatory Visit
Admission: RE | Admit: 2022-11-02 | Discharge: 2022-11-02 | Disposition: A | Discharge status: Home / Self Care | Payer: 59 | Source: Ambulatory Visit | Attending: Family Medicine | Admitting: Family Medicine

## 2022-11-02 DIAGNOSIS — N644 Mastodynia: Secondary | ICD-10-CM

## 2022-11-03 ENCOUNTER — Ambulatory Visit (INDEPENDENT_AMBULATORY_CARE_PROVIDER_SITE_OTHER): Payer: 59

## 2022-11-03 DIAGNOSIS — J309 Allergic rhinitis, unspecified: Secondary | ICD-10-CM

## 2022-11-08 DIAGNOSIS — J3089 Other allergic rhinitis: Secondary | ICD-10-CM | POA: Diagnosis not present

## 2022-11-17 ENCOUNTER — Ambulatory Visit (INDEPENDENT_AMBULATORY_CARE_PROVIDER_SITE_OTHER): Payer: 59 | Admitting: Internal Medicine

## 2022-11-17 DIAGNOSIS — J309 Allergic rhinitis, unspecified: Secondary | ICD-10-CM | POA: Diagnosis not present

## 2022-11-29 DIAGNOSIS — Z1231 Encounter for screening mammogram for malignant neoplasm of breast: Secondary | ICD-10-CM

## 2022-12-01 ENCOUNTER — Ambulatory Visit (INDEPENDENT_AMBULATORY_CARE_PROVIDER_SITE_OTHER): Payer: 59

## 2022-12-01 DIAGNOSIS — J309 Allergic rhinitis, unspecified: Secondary | ICD-10-CM | POA: Diagnosis not present

## 2022-12-15 ENCOUNTER — Ambulatory Visit (INDEPENDENT_AMBULATORY_CARE_PROVIDER_SITE_OTHER): Payer: 59

## 2022-12-15 DIAGNOSIS — J309 Allergic rhinitis, unspecified: Secondary | ICD-10-CM | POA: Diagnosis not present

## 2023-01-07 ENCOUNTER — Ambulatory Visit (INDEPENDENT_AMBULATORY_CARE_PROVIDER_SITE_OTHER): Payer: 59

## 2023-01-07 DIAGNOSIS — J309 Allergic rhinitis, unspecified: Secondary | ICD-10-CM | POA: Diagnosis not present

## 2023-01-13 ENCOUNTER — Ambulatory Visit (INDEPENDENT_AMBULATORY_CARE_PROVIDER_SITE_OTHER): Payer: 59

## 2023-01-13 DIAGNOSIS — J309 Allergic rhinitis, unspecified: Secondary | ICD-10-CM | POA: Diagnosis not present

## 2023-01-28 ENCOUNTER — Ambulatory Visit (INDEPENDENT_AMBULATORY_CARE_PROVIDER_SITE_OTHER): Payer: 59 | Admitting: *Deleted

## 2023-01-28 DIAGNOSIS — J309 Allergic rhinitis, unspecified: Secondary | ICD-10-CM | POA: Diagnosis not present

## 2023-02-11 ENCOUNTER — Ambulatory Visit (INDEPENDENT_AMBULATORY_CARE_PROVIDER_SITE_OTHER): Payer: 59

## 2023-02-11 DIAGNOSIS — J309 Allergic rhinitis, unspecified: Secondary | ICD-10-CM

## 2023-02-18 ENCOUNTER — Ambulatory Visit (INDEPENDENT_AMBULATORY_CARE_PROVIDER_SITE_OTHER): Payer: 59

## 2023-02-18 DIAGNOSIS — J309 Allergic rhinitis, unspecified: Secondary | ICD-10-CM

## 2023-02-25 ENCOUNTER — Ambulatory Visit (INDEPENDENT_AMBULATORY_CARE_PROVIDER_SITE_OTHER): Payer: 59 | Admitting: *Deleted

## 2023-02-25 DIAGNOSIS — J309 Allergic rhinitis, unspecified: Secondary | ICD-10-CM

## 2023-03-02 ENCOUNTER — Ambulatory Visit (INDEPENDENT_AMBULATORY_CARE_PROVIDER_SITE_OTHER): Payer: 59

## 2023-03-02 DIAGNOSIS — J309 Allergic rhinitis, unspecified: Secondary | ICD-10-CM | POA: Diagnosis not present

## 2023-03-16 ENCOUNTER — Ambulatory Visit (INDEPENDENT_AMBULATORY_CARE_PROVIDER_SITE_OTHER): Payer: 59

## 2023-03-16 DIAGNOSIS — J309 Allergic rhinitis, unspecified: Secondary | ICD-10-CM

## 2023-03-23 DIAGNOSIS — J3089 Other allergic rhinitis: Secondary | ICD-10-CM | POA: Diagnosis not present

## 2023-03-23 NOTE — Progress Notes (Signed)
VIALS EXP 03-22-24 

## 2023-04-01 ENCOUNTER — Ambulatory Visit (INDEPENDENT_AMBULATORY_CARE_PROVIDER_SITE_OTHER): Payer: 59 | Admitting: *Deleted

## 2023-04-01 DIAGNOSIS — J309 Allergic rhinitis, unspecified: Secondary | ICD-10-CM

## 2023-04-13 ENCOUNTER — Ambulatory Visit (INDEPENDENT_AMBULATORY_CARE_PROVIDER_SITE_OTHER): Payer: 59 | Admitting: *Deleted

## 2023-04-13 DIAGNOSIS — J309 Allergic rhinitis, unspecified: Secondary | ICD-10-CM

## 2023-04-14 DIAGNOSIS — J301 Allergic rhinitis due to pollen: Secondary | ICD-10-CM

## 2023-05-03 ENCOUNTER — Ambulatory Visit (INDEPENDENT_AMBULATORY_CARE_PROVIDER_SITE_OTHER): Payer: 59 | Admitting: *Deleted

## 2023-05-03 DIAGNOSIS — J309 Allergic rhinitis, unspecified: Secondary | ICD-10-CM

## 2023-05-25 ENCOUNTER — Ambulatory Visit (INDEPENDENT_AMBULATORY_CARE_PROVIDER_SITE_OTHER): Payer: 59 | Admitting: *Deleted

## 2023-05-25 DIAGNOSIS — J309 Allergic rhinitis, unspecified: Secondary | ICD-10-CM | POA: Diagnosis not present

## 2023-06-13 ENCOUNTER — Ambulatory Visit (INDEPENDENT_AMBULATORY_CARE_PROVIDER_SITE_OTHER): Payer: 59

## 2023-06-13 DIAGNOSIS — J309 Allergic rhinitis, unspecified: Secondary | ICD-10-CM

## 2023-07-11 ENCOUNTER — Ambulatory Visit (INDEPENDENT_AMBULATORY_CARE_PROVIDER_SITE_OTHER): Payer: 59

## 2023-07-11 DIAGNOSIS — J309 Allergic rhinitis, unspecified: Secondary | ICD-10-CM | POA: Diagnosis not present

## 2023-07-25 ENCOUNTER — Ambulatory Visit (INDEPENDENT_AMBULATORY_CARE_PROVIDER_SITE_OTHER): Payer: 59

## 2023-07-25 DIAGNOSIS — J309 Allergic rhinitis, unspecified: Secondary | ICD-10-CM | POA: Diagnosis not present

## 2023-08-05 ENCOUNTER — Ambulatory Visit (INDEPENDENT_AMBULATORY_CARE_PROVIDER_SITE_OTHER): Payer: 59 | Admitting: *Deleted

## 2023-08-05 DIAGNOSIS — J309 Allergic rhinitis, unspecified: Secondary | ICD-10-CM | POA: Diagnosis not present

## 2023-08-12 ENCOUNTER — Ambulatory Visit (INDEPENDENT_AMBULATORY_CARE_PROVIDER_SITE_OTHER): Payer: 59

## 2023-08-12 DIAGNOSIS — J309 Allergic rhinitis, unspecified: Secondary | ICD-10-CM

## 2023-08-16 ENCOUNTER — Ambulatory Visit (INDEPENDENT_AMBULATORY_CARE_PROVIDER_SITE_OTHER): Payer: 59

## 2023-08-16 DIAGNOSIS — J309 Allergic rhinitis, unspecified: Secondary | ICD-10-CM | POA: Diagnosis not present

## 2023-09-22 ENCOUNTER — Ambulatory Visit (INDEPENDENT_AMBULATORY_CARE_PROVIDER_SITE_OTHER): Payer: 59 | Admitting: *Deleted

## 2023-09-22 DIAGNOSIS — J309 Allergic rhinitis, unspecified: Secondary | ICD-10-CM | POA: Diagnosis not present

## 2023-11-11 ENCOUNTER — Ambulatory Visit (INDEPENDENT_AMBULATORY_CARE_PROVIDER_SITE_OTHER): Payer: 59

## 2023-11-11 DIAGNOSIS — J309 Allergic rhinitis, unspecified: Secondary | ICD-10-CM | POA: Diagnosis not present

## 2023-11-24 ENCOUNTER — Other Ambulatory Visit: Payer: Self-pay | Admitting: Family Medicine

## 2023-11-24 DIAGNOSIS — Z1231 Encounter for screening mammogram for malignant neoplasm of breast: Secondary | ICD-10-CM

## 2023-12-08 ENCOUNTER — Ambulatory Visit
Admission: RE | Admit: 2023-12-08 | Discharge: 2023-12-08 | Disposition: A | Discharge status: Home / Self Care | Source: Ambulatory Visit | Attending: Family Medicine | Admitting: Family Medicine

## 2023-12-08 DIAGNOSIS — Z1231 Encounter for screening mammogram for malignant neoplasm of breast: Secondary | ICD-10-CM

## 2023-12-23 ENCOUNTER — Ambulatory Visit (INDEPENDENT_AMBULATORY_CARE_PROVIDER_SITE_OTHER)

## 2023-12-23 DIAGNOSIS — J309 Allergic rhinitis, unspecified: Secondary | ICD-10-CM

## 2024-02-02 ENCOUNTER — Ambulatory Visit (INDEPENDENT_AMBULATORY_CARE_PROVIDER_SITE_OTHER)

## 2024-02-02 DIAGNOSIS — J309 Allergic rhinitis, unspecified: Secondary | ICD-10-CM | POA: Diagnosis not present

## 2024-02-27 DIAGNOSIS — J301 Allergic rhinitis due to pollen: Secondary | ICD-10-CM | POA: Diagnosis not present

## 2024-02-27 NOTE — Progress Notes (Signed)
 VIALS MADE 02-27-24

## 2024-02-28 DIAGNOSIS — J3089 Other allergic rhinitis: Secondary | ICD-10-CM | POA: Diagnosis not present

## 2024-04-05 ENCOUNTER — Ambulatory Visit

## 2024-04-05 DIAGNOSIS — J309 Allergic rhinitis, unspecified: Secondary | ICD-10-CM

## 2024-04-12 NOTE — Progress Notes (Signed)
 err

## 2024-06-01 ENCOUNTER — Ambulatory Visit (INDEPENDENT_AMBULATORY_CARE_PROVIDER_SITE_OTHER)

## 2024-06-01 DIAGNOSIS — J309 Allergic rhinitis, unspecified: Secondary | ICD-10-CM

## 2024-06-07 ENCOUNTER — Ambulatory Visit (INDEPENDENT_AMBULATORY_CARE_PROVIDER_SITE_OTHER)

## 2024-06-07 DIAGNOSIS — J309 Allergic rhinitis, unspecified: Secondary | ICD-10-CM | POA: Diagnosis not present

## 2024-06-15 ENCOUNTER — Ambulatory Visit (INDEPENDENT_AMBULATORY_CARE_PROVIDER_SITE_OTHER)

## 2024-06-15 DIAGNOSIS — J309 Allergic rhinitis, unspecified: Secondary | ICD-10-CM | POA: Diagnosis not present

## 2024-06-24 NOTE — Progress Notes (Unsigned)
 Follow Up Note  RE: Lezli Danek MRN: [id number] DOB: [birth year] Date of Office Visit: 06/25/2024  Referring provider: Katina Pfeiffer, PA-C Primary care provider: Katina Pfeiffer, PA-C  Chief Complaint: No chief complaint on file.  History of Present Illness: I had the pleasure of seeing Maudy Yonan for a follow up visit at the [facility] on 06/25/2024. She is a 53 y.o. female, who is being followed for allergic rhinoconjunctivitis on AIT and food allergy . Her previous allergy  office visit was on 12/11/2021 with Arlean Mutter, FNP. Today is a regular follow up visit.  Discussed the use of AI scribe software for clinical note transcription with the patient, who gave verbal consent to proceed.  History of Present Illness            ***  Assessment and Plan: Christy is a 53 y.o. female with: Allergic rhinitis Continue allergen avoidance measures directed toward pollen, mold, and dust mite as listed below Continue allergen immunotherapy once a week and have access to an epinephrine  autoinjector set Continue Claritin mg once a day as needed for runny nose or itch Continue Flonase 2 sprays in each nostril once a day as needed for stuffy nose.  In the right nostril, point the applicator out toward the right ear. In the left nostril, point the applicator out toward the left ear. Consider Xhance if no relief of nasal congestion Consider saline nasal rinses as needed for nasal symptoms. Use this before any medicated nasal sprays for best result   Allergic conjunctivitis Some over the counter eye drops include Pataday one drop in each eye once a day as needed for red, itchy eyes OR Zaditor one drop in each eye twice a day as needed for red itchy eyes.   Food allergy  Continue to avoid chocolate. In case of an allergic reaction, take Benadryl 50 mg every 4 hours, and if life-threatening symptoms occur, inject with EpiPen  0.3 mg. Assessment  and Plan              No follow-ups on file.  No orders of the defined types were placed in this encounter.  Lab Orders  No laboratory test(s) ordered today    Diagnostics: Spirometry:  Tracings reviewed. Her effort: {Blank single:19197::Good reproducible efforts.,It was hard to get consistent efforts and there is a question as to whether this reflects a maximal maneuver.,Poor effort, data can not be interpreted.} FVC: ***L FEV1: ***L, ***% predicted FEV1/FVC ratio: ***% Interpretation: {Blank single:19197::Spirometry consistent with mild obstructive disease,Spirometry consistent with moderate obstructive disease,Spirometry consistent with severe obstructive disease,Spirometry consistent with possible restrictive disease,Spirometry consistent with mixed obstructive and restrictive disease,Spirometry uninterpretable due to technique,Spirometry consistent with normal pattern,No overt abnormalities noted given today's efforts}.  Please see scanned spirometry results for details.  Skin Testing: {Blank single:19197::Select foods,Environmental allergy  panel,Environmental allergy  panel and select foods,Food allergy  panel,None,Deferred due to recent antihistamines use}. *** Results discussed with patient/family.   Medication List:  Current Outpatient Medications  Medication Sig Dispense Refill   acetaminophen -codeine  (TYLENOL  #3) 300-30 MG tablet Take 1-2 tablets by mouth every 6 (six) hours as needed for moderate pain. 15 tablet 0   EPINEPHrine  0.3 mg/0.3 mL IJ SOAJ injection Inject 0.3 mg into the muscle as needed for anaphylaxis. As needed for life-threatening allergic reactions 2 each 1   fluticasone (FLONASE) 50 MCG/ACT nasal spray 1 spray in each nostril     ketotifen (ZADITOR) 0.025 % ophthalmic solution 1 drop into affected eye  levothyroxine (SYNTHROID, LEVOTHROID) 100 MCG tablet Take 100 mcg by mouth daily before breakfast.     loratadine  (CLARITIN) 10 MG tablet 1 tablet     metFORMIN (GLUCOPHAGE-XR) 500 MG 24 hr tablet Take 1 tablet by mouth once.     No current facility-administered medications for this visit.   Allergies: Allergies  Allergen Reactions   Levocetirizine Other (See Comments)    Urinary retention and nightmares   I reviewed her past medical history, social history, family history, and environmental history and no significant changes have been reported from her previous visit.  Review of Systems  Constitutional:  Negative for appetite change, chills, fever and unexpected weight change.  HENT:  Negative for congestion and rhinorrhea.   Eyes:  Negative for itching.  Respiratory:  Negative for cough, chest tightness, shortness of breath and wheezing.   Cardiovascular:  Negative for chest pain.  Gastrointestinal:  Negative for abdominal pain.  Genitourinary:  Negative for difficulty urinating.  Skin:  Negative for rash.  Allergic/Immunologic: Positive for environmental allergies and food allergies.  Neurological:  Negative for headaches.    Objective: There were no vitals taken for this visit. There is no height or weight on file to calculate BMI. Physical Exam Vitals and nursing note reviewed.  Constitutional:      Appearance: Normal appearance. She is well-developed.  HENT:     Head: Normocephalic and atraumatic.     Right Ear: Tympanic membrane and external ear normal.     Left Ear: Tympanic membrane and external ear normal.     Nose: Nose normal.     Mouth/Throat:     Mouth: Mucous membranes are moist.     Pharynx: Oropharynx is clear.  Eyes:     Conjunctiva/sclera: Conjunctivae normal.  Cardiovascular:     Rate and Rhythm: Normal rate and regular rhythm.     Heart sounds: Normal heart sounds. No murmur heard.    No friction rub. No gallop.  Pulmonary:     Effort: Pulmonary effort is normal.     Breath sounds: Normal breath sounds. No wheezing, rhonchi or rales.  Musculoskeletal:      Cervical back: Neck supple.  Skin:    General: Skin is warm.     Findings: No rash.  Neurological:     Mental Status: She is alert and oriented to person, place, and time.  Psychiatric:        Behavior: Behavior normal.    Previous notes and tests were reviewed. The plan was reviewed with the patient/family, and all questions/concerned were addressed.  It was my pleasure to see Mariaceleste today and participate in her care. Please feel free to contact me with any questions or concerns.  Sincerely,  Orlan Cramp, DO Allergy  & Immunology  [facility]  [place] office: [phone] [facility] office: [phone]

## 2024-06-25 ENCOUNTER — Encounter: Payer: Self-pay | Admitting: Allergy

## 2024-06-25 ENCOUNTER — Ambulatory Visit: Admitting: Allergy

## 2024-06-25 ENCOUNTER — Other Ambulatory Visit: Payer: Self-pay

## 2024-06-25 VITALS — BP 110/80 | HR 80 | Temp 98.2°F | Resp 16 | Ht 63.0 in | Wt 213.6 lb

## 2024-06-25 DIAGNOSIS — J301 Allergic rhinitis due to pollen: Secondary | ICD-10-CM

## 2024-06-25 DIAGNOSIS — H1013 Acute atopic conjunctivitis, bilateral: Secondary | ICD-10-CM

## 2024-06-25 DIAGNOSIS — T7819XD Other adverse food reactions, not elsewhere classified, subsequent encounter: Secondary | ICD-10-CM | POA: Diagnosis not present

## 2024-06-25 DIAGNOSIS — H1010 Acute atopic conjunctivitis, unspecified eye: Secondary | ICD-10-CM

## 2024-06-25 DIAGNOSIS — J3089 Other allergic rhinitis: Secondary | ICD-10-CM | POA: Diagnosis not present

## 2024-06-25 DIAGNOSIS — J309 Allergic rhinitis, unspecified: Secondary | ICD-10-CM

## 2024-06-25 MED ORDER — RYALTRIS 665-25 MCG/ACT NA SUSP: 1.0000 | Freq: Two times a day (BID) | NASAL | 5 refills | Status: AC

## 2024-06-25 MED ORDER — EPINEPHRINE 0.3 MG/0.3ML IJ SOAJ: 0.3000 mg | INTRAMUSCULAR | 1 refills | Status: AC | PRN

## 2024-06-25 NOTE — Patient Instructions (Addendum)
 Environmental allergies Continue allergy  injections.  Use over the counter antihistamines such as Zyrtec (cetirizine), Claritin (loratadine), Allegra (fexofenadine) daily as needed. May take twice a day during allergy  flares. May switch antihistamines every few months. Start Ryaltris (olopatadine + mometasone nasal spray combination) 1-2 sprays per nostril twice a day. Sample given. This replaces your other nasal sprays. If this works well for you, then have pharmacy ship the medication to your home - prescription already sent in.  Nasal saline spray (i.e., Simply Saline) or nasal saline lavage (i.e., NeilMed) is recommended as needed and prior to medicated nasal sprays. Use ketotifen (zaditor) eye drops 0.025% twice a day as needed for itchy/watery eyes. This can make the dry eyes worse though. Use refresh eye drops as needed. Consider re-testing if still symptomatic. CPT codes 04995 x 55  CPT codes 04975 x 16  Food Continue to avoid chocolate and gluten. For mild symptoms you can take over the counter antihistamines (zyrtec 10mg  to 20mg ) and monitor symptoms closely.  If symptoms worsen or if you have severe symptoms including breathing issues, throat closure, significant swelling, whole body hives, severe diarrhea and vomiting, lightheadedness then use epinephrine  and seek immediate medical care afterwards. Emergency action plan in place.  Follow up in 1 year or sooner if needed.  Reducing Pollen Exposure Pollen seasons: trees (spring), grass (summer) and ragweed/weeds (fall). Keep windows closed in your home and car to lower pollen exposure.  Install air conditioning in the bedroom and throughout the house if possible.  Avoid going out in dry windy days - especially early morning. Pollen counts are highest between 5 - 10 AM and on dry, hot and windy days.  Save outside activities for late afternoon or after a heavy rain, when pollen levels are lower.  Avoid mowing of grass if you  have grass pollen allergy . Be aware that pollen can also be transported indoors on people and pets.  Dry your clothes in an automatic dryer rather than hanging them outside where they might collect pollen.  Rinse hair and eyes before bedtime.  Control of House Dust Mite Allergen Dust mite allergens are a common trigger of allergy  and asthma symptoms. While they can be found throughout the house, these microscopic creatures thrive in warm, humid environments such as bedding, upholstered furniture and carpeting. Because so much time is spent in the bedroom, it is essential to reduce mite levels there.  Encase pillows, mattresses, and box springs in special allergen-proof fabric covers or airtight, zippered plastic covers.  Bedding should be washed weekly in hot water (130 F) and dried in a hot dryer. Allergen-proof covers are available for comforters and pillows that can't be regularly washed.  Wash the allergy -proof covers every few months. Minimize clutter in the bedroom. Keep pets out of the bedroom.  Keep humidity less than 50% by using a dehumidifier or air conditioning. You can buy a humidity measuring device called a hygrometer to monitor this.  If possible, replace carpets with hardwood, linoleum, or washable area rugs. If that's not possible, vacuum frequently with a vacuum that has a HEPA filter. Remove all upholstered furniture and non-washable window drapes from the bedroom. Remove all non-washable stuffed toys from the bedroom.  Wash stuffed toys weekly.  Mold Control Mold and fungi can grow on a variety of surfaces provided certain temperature and moisture conditions exist.  Outdoor molds grow on plants, decaying vegetation and soil. The major outdoor mold, Alternaria and Cladosporium, are found in very high numbers during hot  and dry conditions. Generally, a late summer - fall peak is seen for common outdoor fungal spores. Rain will temporarily lower outdoor mold spore count, but  counts rise rapidly when the rainy period ends. The most important indoor molds are Aspergillus and Penicillium. Dark, humid and poorly ventilated basements are ideal sites for mold growth. The next most common sites of mold growth are the bathroom and the kitchen. Outdoor (Seasonal) Mold Control Use air conditioning and keep windows closed. Avoid exposure to decaying vegetation. Avoid leaf raking. Avoid grain handling. Consider wearing a face mask if working in moldy areas.  Indoor (Perennial) Mold Control  Maintain humidity below 50%. Get rid of mold growth on hard surfaces with water, detergent and, if necessary, 5% bleach (do not mix with other cleaners). Then dry the area completely. If mold covers an area more than 10 square feet, consider hiring an indoor environmental professional. For clothing, washing with soap and water is best. If moldy items cannot be cleaned and dried, throw them away. Remove sources e.g. contaminated carpets. Repair and seal leaking roofs or pipes. Using dehumidifiers in damp basements may be helpful, but empty the water and clean units regularly to prevent mildew from forming. All rooms, especially basements, bathrooms and kitchens, require ventilation and cleaning to deter mold and mildew growth. Avoid carpeting on concrete or damp floors, and storing items in damp areas.

## 2024-07-23 ENCOUNTER — Ambulatory Visit (INDEPENDENT_AMBULATORY_CARE_PROVIDER_SITE_OTHER)

## 2024-07-23 DIAGNOSIS — J309 Allergic rhinitis, unspecified: Secondary | ICD-10-CM | POA: Diagnosis not present

## 2024-08-28 ENCOUNTER — Ambulatory Visit

## 2024-08-28 DIAGNOSIS — J309 Allergic rhinitis, unspecified: Secondary | ICD-10-CM | POA: Diagnosis not present

## 2024-09-04 ENCOUNTER — Encounter (INDEPENDENT_AMBULATORY_CARE_PROVIDER_SITE_OTHER)

## 2024-10-12 ENCOUNTER — Ambulatory Visit (INDEPENDENT_AMBULATORY_CARE_PROVIDER_SITE_OTHER)

## 2024-10-12 DIAGNOSIS — J302 Other seasonal allergic rhinitis: Secondary | ICD-10-CM | POA: Diagnosis not present

## 2025-06-24 ENCOUNTER — Ambulatory Visit: Admitting: Allergy
